# Patient Record
Sex: Female | Born: 1950 | Race: White | Hispanic: No | Marital: Married | Smoking: Never smoker
Health system: Southern US, Community
[De-identification: ages and names within clinical notes are randomized; demographics above are authoritative.]

## PROBLEM LIST (undated history)

## (undated) DIAGNOSIS — C801 Malignant (primary) neoplasm, unspecified: Secondary | ICD-10-CM

## (undated) DIAGNOSIS — Z8709 Personal history of other diseases of the respiratory system: Secondary | ICD-10-CM

## (undated) DIAGNOSIS — R0789 Other chest pain: Secondary | ICD-10-CM

## (undated) DIAGNOSIS — H33319 Horseshoe tear of retina without detachment, unspecified eye: Secondary | ICD-10-CM

## (undated) DIAGNOSIS — M170 Bilateral primary osteoarthritis of knee: Secondary | ICD-10-CM

## (undated) DIAGNOSIS — Z659 Problem related to unspecified psychosocial circumstances: Secondary | ICD-10-CM

## (undated) DIAGNOSIS — Z8659 Personal history of other mental and behavioral disorders: Secondary | ICD-10-CM

## (undated) HISTORY — PX: KNEE SURGERY: SHX244

## (undated) HISTORY — DX: Horseshoe tear of retina without detachment, unspecified eye: H33.319

## (undated) HISTORY — DX: Personal history of other diseases of the respiratory system: Z87.09

## (undated) HISTORY — DX: Bilateral primary osteoarthritis of knee: M17.0

## (undated) HISTORY — PX: FOOT SURGERY: SHX648

## (undated) HISTORY — DX: Problem related to unspecified psychosocial circumstances: Z65.9

## (undated) HISTORY — DX: Personal history of other mental and behavioral disorders: Z86.59

## (undated) HISTORY — DX: Other chest pain: R07.89

---

## 1980-03-22 ENCOUNTER — Encounter: Payer: Self-pay | Admitting: Internal Medicine

## 1991-11-17 ENCOUNTER — Encounter: Payer: Self-pay | Admitting: Internal Medicine

## 1999-06-21 ENCOUNTER — Other Ambulatory Visit: Admission: RE | Admit: 1999-06-21 | Discharge: 1999-06-21 | Payer: Self-pay | Admitting: *Deleted

## 1999-07-07 ENCOUNTER — Other Ambulatory Visit: Admission: RE | Admit: 1999-07-07 | Discharge: 1999-07-07 | Payer: Self-pay | Admitting: Emergency Medicine

## 1999-10-31 ENCOUNTER — Ambulatory Visit (HOSPITAL_COMMUNITY): Admission: RE | Admit: 1999-10-31 | Discharge: 1999-10-31 | Payer: Self-pay | Admitting: *Deleted

## 1999-10-31 ENCOUNTER — Encounter (INDEPENDENT_AMBULATORY_CARE_PROVIDER_SITE_OTHER): Payer: Self-pay

## 2000-07-05 ENCOUNTER — Other Ambulatory Visit: Admission: RE | Admit: 2000-07-05 | Discharge: 2000-07-05 | Payer: Self-pay | Admitting: *Deleted

## 2001-07-16 ENCOUNTER — Other Ambulatory Visit: Admission: RE | Admit: 2001-07-16 | Discharge: 2001-07-16 | Payer: Self-pay | Admitting: Obstetrics and Gynecology

## 2001-07-22 ENCOUNTER — Encounter: Payer: Self-pay | Admitting: Obstetrics and Gynecology

## 2001-07-22 ENCOUNTER — Encounter: Admission: RE | Admit: 2001-07-22 | Discharge: 2001-07-22 | Payer: Self-pay | Admitting: Obstetrics and Gynecology

## 2001-10-23 ENCOUNTER — Encounter: Admission: RE | Admit: 2001-10-23 | Discharge: 2001-10-23 | Payer: Self-pay | Admitting: Pediatrics

## 2001-10-23 ENCOUNTER — Encounter: Payer: Self-pay | Admitting: Pediatrics

## 2001-10-23 ENCOUNTER — Ambulatory Visit (HOSPITAL_COMMUNITY): Admission: RE | Admit: 2001-10-23 | Discharge: 2001-10-23 | Payer: Self-pay | Admitting: Pediatrics

## 2001-12-25 ENCOUNTER — Encounter: Payer: Self-pay | Admitting: Obstetrics and Gynecology

## 2001-12-25 ENCOUNTER — Encounter: Admission: RE | Admit: 2001-12-25 | Discharge: 2001-12-25 | Payer: Self-pay | Admitting: Obstetrics and Gynecology

## 2002-01-07 ENCOUNTER — Encounter: Admission: RE | Admit: 2002-01-07 | Discharge: 2002-01-07 | Payer: Self-pay | Admitting: Orthopedic Surgery

## 2002-01-07 ENCOUNTER — Encounter: Payer: Self-pay | Admitting: Orthopedic Surgery

## 2002-04-01 ENCOUNTER — Encounter: Payer: Self-pay | Admitting: Internal Medicine

## 2002-05-22 ENCOUNTER — Ambulatory Visit (HOSPITAL_COMMUNITY): Admission: RE | Admit: 2002-05-22 | Discharge: 2002-05-22 | Payer: Self-pay | Admitting: Obstetrics and Gynecology

## 2002-05-22 ENCOUNTER — Encounter: Payer: Self-pay | Admitting: Obstetrics and Gynecology

## 2002-06-30 ENCOUNTER — Encounter: Admission: RE | Admit: 2002-06-30 | Discharge: 2002-06-30 | Payer: Self-pay | Admitting: Sports Medicine

## 2002-08-05 ENCOUNTER — Encounter: Payer: Self-pay | Admitting: Internal Medicine

## 2002-08-27 ENCOUNTER — Encounter: Admission: RE | Admit: 2002-08-27 | Discharge: 2002-08-27 | Payer: Self-pay | Admitting: Allergy and Immunology

## 2002-08-27 ENCOUNTER — Encounter: Payer: Self-pay | Admitting: Internal Medicine

## 2002-09-08 ENCOUNTER — Other Ambulatory Visit: Admission: RE | Admit: 2002-09-08 | Discharge: 2002-09-08 | Payer: Self-pay | Admitting: Obstetrics and Gynecology

## 2002-09-09 ENCOUNTER — Encounter: Payer: Self-pay | Admitting: Internal Medicine

## 2003-01-28 ENCOUNTER — Encounter: Payer: Self-pay | Admitting: Internal Medicine

## 2003-11-16 ENCOUNTER — Other Ambulatory Visit: Admission: RE | Admit: 2003-11-16 | Discharge: 2003-11-16 | Payer: Self-pay | Admitting: Obstetrics and Gynecology

## 2005-02-09 ENCOUNTER — Other Ambulatory Visit: Admission: RE | Admit: 2005-02-09 | Discharge: 2005-02-09 | Payer: Self-pay | Admitting: Obstetrics and Gynecology

## 2005-07-11 ENCOUNTER — Encounter: Payer: Self-pay | Admitting: Internal Medicine

## 2005-11-12 ENCOUNTER — Encounter: Payer: Self-pay | Admitting: Internal Medicine

## 2006-03-06 ENCOUNTER — Other Ambulatory Visit: Admission: RE | Admit: 2006-03-06 | Discharge: 2006-03-06 | Payer: Self-pay | Admitting: Obstetrics and Gynecology

## 2007-03-12 ENCOUNTER — Other Ambulatory Visit: Admission: RE | Admit: 2007-03-12 | Discharge: 2007-03-12 | Payer: Self-pay | Admitting: Obstetrics and Gynecology

## 2007-08-15 ENCOUNTER — Encounter: Payer: Self-pay | Admitting: Internal Medicine

## 2008-03-03 ENCOUNTER — Encounter: Payer: Self-pay | Admitting: Internal Medicine

## 2008-03-17 ENCOUNTER — Other Ambulatory Visit: Admission: RE | Admit: 2008-03-17 | Discharge: 2008-03-17 | Payer: Self-pay | Admitting: Obstetrics and Gynecology

## 2008-09-22 ENCOUNTER — Encounter: Payer: Self-pay | Admitting: Internal Medicine

## 2009-03-21 ENCOUNTER — Other Ambulatory Visit: Admission: RE | Admit: 2009-03-21 | Discharge: 2009-03-21 | Payer: Self-pay | Admitting: Obstetrics and Gynecology

## 2009-07-18 ENCOUNTER — Encounter: Payer: Self-pay | Admitting: Internal Medicine

## 2009-08-01 ENCOUNTER — Ambulatory Visit: Payer: Self-pay | Admitting: Internal Medicine

## 2009-08-01 DIAGNOSIS — M129 Arthropathy, unspecified: Secondary | ICD-10-CM | POA: Insufficient documentation

## 2009-08-01 DIAGNOSIS — R0602 Shortness of breath: Secondary | ICD-10-CM | POA: Insufficient documentation

## 2009-08-01 DIAGNOSIS — J45909 Unspecified asthma, uncomplicated: Secondary | ICD-10-CM | POA: Insufficient documentation

## 2009-08-23 ENCOUNTER — Ambulatory Visit: Payer: Self-pay | Admitting: Internal Medicine

## 2009-08-23 DIAGNOSIS — R0982 Postnasal drip: Secondary | ICD-10-CM | POA: Insufficient documentation

## 2009-08-30 ENCOUNTER — Encounter: Payer: Self-pay | Admitting: Internal Medicine

## 2009-08-30 ENCOUNTER — Ambulatory Visit (HOSPITAL_COMMUNITY): Admission: RE | Admit: 2009-08-30 | Discharge: 2009-08-30 | Payer: Self-pay | Admitting: Internal Medicine

## 2009-09-12 ENCOUNTER — Telehealth: Payer: Self-pay | Admitting: Internal Medicine

## 2009-09-13 ENCOUNTER — Encounter: Payer: Self-pay | Admitting: Internal Medicine

## 2009-10-19 ENCOUNTER — Encounter: Payer: Self-pay | Admitting: Internal Medicine

## 2009-10-19 ENCOUNTER — Ambulatory Visit (HOSPITAL_COMMUNITY): Admission: RE | Admit: 2009-10-19 | Discharge: 2009-10-19 | Payer: Self-pay | Admitting: Internal Medicine

## 2009-11-04 ENCOUNTER — Telehealth (INDEPENDENT_AMBULATORY_CARE_PROVIDER_SITE_OTHER): Payer: Self-pay | Admitting: *Deleted

## 2009-11-16 ENCOUNTER — Ambulatory Visit: Payer: Self-pay | Admitting: Internal Medicine

## 2009-11-17 ENCOUNTER — Telehealth (INDEPENDENT_AMBULATORY_CARE_PROVIDER_SITE_OTHER): Payer: Self-pay | Admitting: *Deleted

## 2009-12-01 ENCOUNTER — Encounter: Payer: Self-pay | Admitting: Internal Medicine

## 2009-12-05 ENCOUNTER — Encounter: Payer: Self-pay | Admitting: Internal Medicine

## 2009-12-29 ENCOUNTER — Telehealth: Payer: Self-pay | Admitting: Internal Medicine

## 2010-01-18 ENCOUNTER — Telehealth (INDEPENDENT_AMBULATORY_CARE_PROVIDER_SITE_OTHER): Payer: Self-pay | Admitting: *Deleted

## 2010-04-17 ENCOUNTER — Other Ambulatory Visit
Admission: RE | Admit: 2010-04-17 | Discharge: 2010-04-17 | Payer: Self-pay | Source: Home / Self Care | Admitting: Obstetrics and Gynecology

## 2010-06-13 NOTE — Progress Notes (Signed)
Summary: records and referral  Phone Note Call from Patient Call back at [facility] Phone [phone]   Caller: Patient Call For: ramaswamy Reason for Call: Talk to Nurse Summary of Call: pt has appt tomorrow morning at 8:40am - wanted to know if records had been sent to Dr. Arlyss Gandy for allergy appt tomorrow?  Also, checking on Physical Therapy.  MR was going to have someone call her about setting this up. Initial call taken by: Eugene Gavia,  January 18, 2010 1:48 PM  Follow-up for Phone Call        Spoke with pt and made aware that order was sent to pulm rehab and that the people from [facility] will call her with appt details.  Pt verbalized understanding.  She states that she thinks that Dr Willa Rough who was the doc who sent her to see MR needs most recent records.  I faxed all ov notes, MCT, and PFTs to Dr Willa Rough at [phone]. Pt aware. Follow-up by: Vernie Murders,  January 18, 2010 3:18 PM

## 2010-06-13 NOTE — Progress Notes (Signed)
Summary: test results  Phone Note Call from Patient Call back at [phone]   Caller: Patient Call For: ramaswamy Reason for Call: Lab or Test Results Summary of Call: Wants results of stress test. Initial call taken by: Darletta Moll,  November 17, 2009 1:04 PM  Follow-up for Phone Call        MR, pt calling for results of stress test.  Test was done on October 19, 2009 - pt still has not heard anything re: results.  Pls advise.  Thanks.  Gweneth Dimitri RN  November 17, 2009 2:05 PM   Additional Follow-up for Phone Call Additional follow up Details #1::        Finally computer worked for me to rview. I have been doing some final discussions with the tech and havbe been emailing past 2 days. I should give a call ti patient today/tomorrow Additional Follow-up by: Kalman Shan MD,  November 17, 2009 2:17 PM    Additional Follow-up for Phone Call Additional follow up Details #2::    Spoke with pt and advised that MR stated he should be giving her a call today or tommorrow with these results. Follow-up by: Vernie Murders,  November 17, 2009 2:19 PM   Appended Document: test results d/w patient: pls change primar contact to cell [phone] her cell. CPST shows restriction in her chest as cause of dyspnea. Unclear why she would have that. ADvised referral to Dr Brayton El at [facility] neurology to rule out neuromuscular disorders. Pls set this referral up and let her know on her cell.After she sees Dr. Terrace Arabia she should come and see me  Appended Document: Orders Update    Clinical Lists Changes  Orders: Added new Referral order of Neurology Referral (Neuro) - Signed  Order place and sent to [facility] with all instructions to give patient regarding follow-up. Michel Bickers [facility]  November 18, 2009 9:48 AM      Appended Document: test results She saw Dr. Terrace Arabia at [facility] neuro on 7/25. Pls call that office and get blood test results that Dr. Terrace Arabia did.  Appended Document: test results I have called Dr Zannie Cove office  and requested that these labs be faxed to triage faxline.    Appended Document: test results Labs in MR's lookat  Appended Document: test results reviewed TSH, AChR Bindding Abs, Total CK - 12/05/2009 all normal at Dr Terrace Arabia office. I will call her to discuss

## 2010-06-13 NOTE — Progress Notes (Signed)
Summary: results  Phone Note Call from Patient   Caller: Patient Call For: TRIAGE Summary of Call: pt wants results of pulm stress test. cell [phone] Initial call taken by: Tivis Ringer, CNA,  November 04, 2009 3:17 PM  Follow-up for Phone Call        Spoke with pt and advised that MR out of the office until 11/10/09 and will address her results after he returns.  Pt okay with waiting for results. Follow-up by: Vernie Murders,  November 04, 2009 3:27 PM  Additional Follow-up for Phone Call Additional follow up Details #1::        i am also backed up with computer crashing x 3 days before I left for [place] Additional Follow-up by: Kalman Shan MD,  November 10, 2009 4:53 PM    Additional Follow-up for Phone Call Additional follow up Details #2::    Please advise when test results are ready.Michel Bickers CMA  November 10, 2009 5:02 PM   Appended Document: results Signed by mistake. Will forward to Dr. Marchelle Gearing.

## 2010-06-13 NOTE — Miscellaneous (Signed)
Summary: Orders Update pft charges  Clinical Lists Changes  Orders: Added new Service order of Carbon Monoxide diffusing w/capacity (94720) - Signed Added new Service order of Lung Volumes (94240) - Signed Added new Service order of Spirometry (Pre & Post) (94060) - Signed 

## 2010-06-13 NOTE — Consult Note (Signed)
Summary: [facility]  [facility]   Imported By: Lennie Odor 12/19/2009 14:57:50  _____________________________________________________________________  External Attachment:    Type:   Image     Comment:   External Document

## 2010-06-13 NOTE — Assessment & Plan Note (Signed)
Summary: full pft 2-3wk reck/klw   Visit Type:  Follow-up Copy to:  Dr. Colonel Bald  Primary Provider/Referring Provider:  Dr. Shaune Pollack - PMD, Dr Willa Rough - Allergist.  CC:  Pt here for follow-up to discuss pft results. .  History of Present Illness: IOV 08/01/2009: 60 year old female. c/o difficulty breathing or shortness of breath.  Insidious onset. Present for a long long time. She thinks this is the way she is. Present even high school. Worse in spring  Dyspnea associated with upper-Chest tightness and lower neck tightness. Feels that she is unable to take a good deep breath and fill her lungs with enough air.  Made worse by stress before pft's, spring time, . Helped by coffee.  Associated post nasal drip present but is kept under control by allergy shots.   Was on allergy shots for above for "first 20 years of life".  REsumed at age [age] around 49 when she moved to [place], [place] from [place].  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  Recollects that one spring at [place], [place] chest tightness was significant enought due to pollen that she was treated with prednisone. This helped. Since then she recollects 1 or 2 more prednisoen bursts usually in spring. Last prednisone intake few years ago. Been in [place] since 2000 and been patient of Dr. Willa Rough allergy since then. Did have PFTs at [facility] 2002-2004 but now not retrievable. It appears this was abnormal. Had CT chest 08/27/2002 at [facility] and normal per report  Recent stress past few years; sick daughter with PANDIS due to strep in 2008 but now alcohol issues. Says thinking of duaghter's issues chokes her up and causes chest tightness. Feels duaghter's issue is 'taking a toll on my nerves". This has accentuated symptoms. Feels talking about this is provoking symptoms. Reports fear of public speaking  Per patient, Dr Willa Rough feels annual  spirometries are deteriorating and therefore referred here. REview of thse spiro 2009 - > 2011 all show restrictio 75%. QVAR and singulair not helping  Denies associated wheezing, hemoptysis, fever, chills, night sweats, weight loss, oprthopnea, paroxysmal nocutrnal dyspnea.   REC: PFTs after QVAR/singulair wash OV 08/19/2009:Followup after PFTs. She is here with husband today. Symptoms persist. Today she is repoting for first time mild-moderate stable, chronic post nasal drainage that results in tickle in throat. Otherwise no new symptoms. She tried to get old records from her ENT in [place] where 'laryngospasm' was diagnosed but charts have been destroyed. She has other old charts which she faxed to wrong number but will try again. Had PFTs today. I independently reviewed it. Cleda Daub suggests restriction with FVC at 2L/53% but TLC is 5L/81% and normal, DLCO is 21/92%. There was no BD response  Current Medications (verified): 1)  Wellbutrin Xl 150 Mg Xr24h-Tab (Bupropion Hcl) .... Two Times A Day 2)  Proventil Hfa 108 (90 Base) Mcg/act Aers (Albuterol Sulfate) .... 2 Puffs Every 4 Hours As Needed 3)  Multivitamins  Tabs (Multiple Vitamin) .... Once Daily 4)  Vitamin D 2000 Unit Tabs (Cholecalciferol) .... Once Daily  Allergies (verified): No Known Drug Allergies  Past History:  Family History: Last updated: 08/01/2009 allergies - mother asthma- mother heart disease - father cancer - MGM- uterine, PGM ? 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 #ARTHRITIS (ICD-716.90) #ASTHMA (ICD-493.90) #ALLERGIES > Per Hx proven skin test positive to mold, dust, grass, mites, pollen, etc > Symptoms when not under control are runny nose, red eyes, cough, sinus, trees, dog,  cats POST NASAL DRIP > regardless of allergy control #Denies bp, dm, stroke, hyperliipdiemia, lupus #Lot of stress, fear of public speaking  Past Surgical History: Reviewed history from 08/01/2009 and no changes required. right and left knee surgery  right foot surgery D & C  Family History: Reviewed history from 08/01/2009 and no changes required. allergies - mother asthma- mother heart disease - father cancer - MGM- uterine, PGM ? type, PGF-hogkins  Social History: Reviewed history from 08/01/2009 and no changes required. 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  Comments Medications reviewed with patient Carron Curie CMA  August 23, 2009 10:48 AM Daytime phone number verified with patient.    Physical Exam  General:  well developed, well nourished, in no acute distress Head:  normocephalic and atraumatic Eyes:  PERRLA/EOM intact; conjunctiva and sclera clear Ears:  TMs intact and clear with normal canals Nose:  no deformity, discharge, inflammation, or lesions Mouth:  no deformity or lesions Neck:  no masses, thyromegaly, or abnormal cervical nodes Chest Wall:  no deformities noted Lungs:  clear bilaterally to auscultation and percussion Heart:  regular rate and rhythm, S1, S2 without murmurs, rubs, gallops, or clicks Abdomen:  bowel sounds positive; abdomen soft and non-tender without masses, or organomegaly Msk:  no deformity or scoliosis noted with  normal posture Pulses:  pulses normal Extremities:  no clubbing, cyanosis, edema, or deformity noted Neurologic:  CN II-XII grossly intact with normal reflexes, coordination, muscle strength and tone Skin:  intact without lesions or rashes Cervical Nodes:  no significant adenopathy Axillary Nodes:  no significant adenopathy Psych:  depressed affect and anxious.  Bit nervous. Saus she feels like getting choked talking about her daughter   MISC. Report  Procedure date:  08/23/2009  Findings:      PFTs today. I independently reviewed it. Cleda Daub suggests restriction with FVC at 2L/53% but TLC is 5L/81% and normal, DLCO is 21/92%. There was no BD response  Impression & Recommendations:  Problem # 1:  POSTNASAL DRIP (ICD-784.91) Assessment New start fluticasone nasal spray  Orders: Prescription Created Electronically [phone]) Est. Patient Level III (57322)  Problem # 2:  SHORTNESS OF BREATH (SOB) (ICD-786.05) Assessment: Unchanged  Orders: Pulmonary Referral (Pulmonary) Est. Patient Level III (02542)  Chronic dyspnea with some asthma features and some VCD features in non-smoking woman nasal allergies but with restrictive sprimetries. I am not sure if there is just one etiology or multiple. I am not sure if she has co-existent asthma or not. Possibly VCD (  vocal cord dysfn) can explain everything. PFTs 08/23/2009: essentially normal except spiro is suggestive of restriction  PLAN Gaver her corrext fax number to fax old records Get methacholine challenge test If positive, treat for asthma If negative, do CPST and then Lary for VCD (she is veyr nervous about lary and will do it as last test)  Medications Added to Medication List This Visit: 1)  Fluticasone Propionate 50 Mcg/act Susp (Fluticasone propionate) .... 2 sprays into each nostril bid  Patient Instructions: 1)  please fax your old records to [phone] under my attention 2)  please have methacholine challenge test 3)  once  test is done, we can decide on phone if you need additional testing or you come and see me 4)  call me once testing is complete to review [phone] 5)  take some nasal steroid samples (2-3), use it 2 squirts each nostril daily for post nasal drrip along with nett pot 6)    Prescriptions: FLUTICASONE PROPIONATE 50 MCG/ACT SUSP (FLUTICASONE PROPIONATE) 2 sprays into each nostril bid  #1 x 6   Entered and Authorized by:   Kalman Shan MD   Signed by:   Kalman Shan MD on 08/23/2009   Method used:   Electronically to        [facility] [address] [phone]* (retail)       [address]       [place], [place]  [address]       Ph: [phone] or [phone]       Fax: [phone]   RxID:   [id number]   Appended Document: full pft 2-3wk reck/klw called and d/w patient. STill with symptoms. Cannot take a good deep breath. Neuromuscular workup negative. HAve advised pulmonary rehab (if cost and time factor ok for her). IF choking symptoms persist despite rehab or otherwise more than dyspnea, then will refer to speech therapy. FOr now, rehab trial.   Candise Bowens: pls make a referral for pulmonary rehab  Appended Document: pulm rehab referral    Clinical Lists Changes  Orders: Added new Referral order of Rehabilitation Referral (Rehab) - Signed

## 2010-06-13 NOTE — Letter (Signed)
Summary: CPST- R/O Contraindications  [facility]  [address]   [place], [place] [address]   Phone: [phone]  Fax: [phone]    Patient's Name: Anna Kennedy Date of Birth: February 23, 1951 MRN: [id number]  *********Rule out Contraindications**************** Absolute                                                                                                                           ___ Acute MI (3-5 Days)                                 ___ Unstable Angina                                          ___ Uncontrolled arrhythmias causing symptoms or hemodynamic compromise. ___ Syncope                                                     ___ Active endocarditis                                         ___ Acute Myocarditis/Pericarditis                        ___ Symptomatic severe aortic stenosis  ___ Acute Pulmonary embolus or pulmonary infarction                ___ Uncontrolled Heart Failure  ___ Thrombosis of lower extremitie ___ Suspected dissecting aneurysm ___ Uncontrolled Asthma                          ___ Pulmonary Edema                                        ___ RA desat @ rest<85%                                      ___ Repiratory Failure                                         ___ Acute noncardiopulmonary disorder that may affect exercise performance or be         aggravated by exercise (ie infection, renal failure,  thyrotoxicosis) .                               ___ Mental impairment leading to inabliity to cooperate   Relative ___ Left main coronary stenosis or its equivalent ___ Moderate stentoic valvular heart disease ___ Severe untreated arterial hypertension @ rest (<200 mmHg             systolic,>150mmHg Diastolic ___ Tachy/Brady Arrhythmias ___ High- degree artioventricular block ___ Hypertrophic cardiomyopathy ___ Significant pulmonary hypertension ___ Advanced or complicated pregnancy ___ Electrolyte abnormalities ___ Orthopedic impairment that  compromises exercise performance  NONE  Kalman Shan MD    [facility]

## 2010-06-13 NOTE — Progress Notes (Signed)
Summary: refer pulm rehab  Phone Note Outgoing Call   Summary of Call: this reflects conversation on 12/23/2009. Advied pulmonary rehab. See addendum to april note. ORder was placed Initial call taken by: Kalman Shan MD,  December 29, 2009 4:43 PM

## 2010-06-13 NOTE — Progress Notes (Signed)
Summary: RESULTS -  Phone Note Call from Patient Call back at [phone]   Caller: Patient Call For: [facility] Summary of Call: RESULTS OF PFT Initial call taken by: Rickard Patience,  Sep 12, 2009 5:01 PM  Follow-up for Phone Call        Please advise on methacoline challenge test results. results in your look-at.  Carron Curie CMA  Sep 12, 2009 5:06 PM   Additional Follow-up for Phone Call Additional follow up Details #1::        byrum yet to read it. only prelim copy present. Pls check if Byrum read it and please place final copy at my look at. Pls let patient know Additional Follow-up by: Kalman Shan MD,  Sep 13, 2009 2:23 AM    Additional Follow-up for Phone Call Additional follow up Details #2::    ATC pt at both #s listed - LMOM TCB on each.  ....   Pt returned our call.  Informed her that we only have the prelim report back and are still waiting on the final results from the interpreting physician.  informed pt we will call her once we get these results.  pt asked to be called on her cell at [phone] .Marland KitchenAundra Millet Reynolds LPN  Sep 13, 1912 10:50 AM   Lawson Fiscal, have you seen this methacoline for pt? Boone Master CNA  Sep 13, 2009 9:47 AM   Printed and given to MR. Leslye Peer MD  Sep 13, 2009 5:33 PM  Follow-up by: Leslye Peer MD,  Sep 13, 2009 5:33 PM  Additional Follow-up for Phone Call Additional follow up Details #3:: Details for Additional Follow-up Action Taken: methacholine is negative for asthma. Therefore, no asthma. Plse tell her to continue to hold her asthma meds. Pls order CPST at [facility] with exercise induced bronchspasm test. FU after that Additional Follow-up by: Kalman Shan MD,  Sep 13, 2009 5:54 PM  pt advised of results. Order palced for cpst.Jennifer [facility] CMA  Sep 14, 2009 9:07 AM

## 2010-06-13 NOTE — Letter (Signed)
Summary: [facility]  [facility]   Imported By: Sherian Rein 09/13/2009 11:24:46  _____________________________________________________________________  External Attachment:    Type:   Image     Comment:   External Document

## 2010-06-13 NOTE — Letter (Signed)
Summary: Penni Homans MD  Penni Homans MD   Imported By: Sherian Rein 09/13/2009 11:23:19  _____________________________________________________________________  External Attachment:    Type:   Image     Comment:   External Document

## 2010-06-13 NOTE — Letter (Signed)
Summary: [facility]  [address]   [place], [place] [address]   Phone: [phone]  Fax: [phone]     Patient's Name: Anna Kennedy Date of Birth: 1950/11/09 MRN: [id number]  CPST  Choose test method and choice  a)__x_Bike - recommended by ATS/ACCP. Do at [facility] at Dr. Gala Romney [facility]  b)___Treadmill - less preferred. Do at [facility] at Dr. Gala Romney lab or do at [facility] PFT lab  Choose one or more indication for test  INDICATIONS FOR CARDIOPULMONARY EXERCISE TESTING Evaluation of exercise tolerance ______ Determination of functional impairment or capacity (peak V? O2) ______ Determination of exercise-limiting factors and pathophysiologic mechanisms  Evaluation of undiagnosed exercise intolerance __x___ Assessing contribution of cardiac and pulmonary etiology in coexisting disease __x___ Symptoms disproportionate to resting pulmonary and cardiac tests  __x___Unexplained dyspnea when initial cardiopulmonary testing is nondiagnostic  Evaluation of patients with cardiovascular disease _____ Functional evaluation and prognosis in patients with heart failure _____ Selection for cardiac transplantation _____ Exercise prescription and monitoring response to exercise training for cardiac rehabilitation (special circumstances; i.e., pacemakers)  Evaluation of patients with respiratory disease _____ Functional impairment assessment (see specific clinical applications)  _____Chronic obstructive pulmonary disease Establishing exercise limitation(s) and assessing other potential contributing factors, especially occult heart disease (ischemia) ______Determination of magnitude of hypoxemia and for O2 prescription When objective determination of therapeutic intervention is necessary and not adequately addressed by standard pulmonary function testing  _____ Interstitial lung diseases _____Detection of early (occult) gas exchange abnormalities _____Overall  assessment/monitoring of pulmonary gas exchange _____Determination of magnitude of hypoxemia and for O2 prescription _____Determination of potential exercise-limiting factors _____Documentation of therapeutic response to potentially toxic therapy  ____ Pulmonary vascular disease (careful risk-benefit analysis required)  ____ Cystic fibrosis  __x__ Exercise-induced bronchospasm  Specific clinical applications ____  Preoperative evaluation _____Lung resectional surgery _____Elderly patients undergoing major abdominal surgery _____Lung volume resectional surgery for emphysema (currently investigational)  ____ Exercise evaluation and prescription for pulmonary rehabilitation  ____ Evaluation for impairment-disability  ____ Evaluation for lung, heart-lung transplantation ____ Definition of abbreviation: V? O2______ -oxygen consumption.    Kalman Shan MD    [facility]

## 2010-06-13 NOTE — Assessment & Plan Note (Signed)
Summary: obstructive lung disease/jd   Visit Type:  Initial Consult Copy to:  Dr. Colonel Bald  Primary Provider/Referring Provider:  Dr. Shaune Pollack - PMD, Dr Willa Rough - Allergist.  CC:  Pulomonary Consult for asthma..  History of Present Illness: IOV 08/01/2009: 60 year old female. c/o difficulty breathing or shortness of breath.  Insidious onset. Present for a long long time. She thinks this is the way she is. Present even high school. Worse in spring  Dyspnea associated with upper-Chest tightness and lower neck tightness. Feels that she is unable to take a good deep breath and fill her lungs with enough air.  Made worse by stress before pft's, spring time, . Helped by coffee.  Associated post nasal drip present but is kept under control by allergy shots.   Was on allergy shots for above for "first 20 years of life".  REsumed at age [age] around 21 when she moved to [place], [place] from [place].  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  Recollects that one spring at [place], [place] chest tightness was significant enought due to pollen that she was treated with prednisone. This helped. Since then she recollects 1 or 2 more prednisoen bursts usually in spring. Last prednisone intake few years ago. Been in [place] since 2000 and been patient of Dr. Willa Rough allergy since then. Did have PFTs at [facility] 2002-2004 but now not retrievable. It appears this was abnormal. Had CT chest 08/27/2002 at [facility] and normal per report  Recent stress past few years; sick daughter with PANDIS due to strep in 2008 but now alcohol issues. Says thinking of duaghter's issues chokes her up and causes chest tightness. Feels duaghter's issue is 'taking a toll on my nerves". This has accentuated symptoms. Feels talking about this is provoking symptoms. Reports fear of public speaking  Per patient, Dr Willa Rough feels annual  spirometries are deteriorating and therefore referred here. REview of thse spiro 2009 - > 2011 all show restrictio 75%. QVAR and singulair not helping  Denies associated wheezing, hemoptysis, fever, chills, night sweats, weight loss, oprthopnea, paroxysmal nocutrnal dyspnea.   Current Medications (verified): 1)  Singulair 10 Mg Tabs (Montelukast Sodium) .... Once Daily 2)  Wellbutrin Xl 150 Mg Xr24h-Tab (Bupropion Hcl) .... Two Times A Day 3)  Qvar 40 Mcg/act Aers (Beclomethasone Dipropionate) .... 2 Puffs Two Times A Day 4)  Proventil Hfa 108 (90 Base) Mcg/act Aers (Albuterol Sulfate) .... 2 Puffs Every 4 Hours As Needed 5)  Multivitamins  Tabs (Multiple Vitamin) .... Once Daily 6)  Vitamin D 2000 Unit Tabs (Cholecalciferol) .... Once Daily  Allergies (verified): No Known Drug Allergies  Past History:  Family History: Last updated: 08/01/2009 allergies - mother asthma- mother heart disease - father cancer - MGM- uterine, PGM ? 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  The patient denies productive cough, non-productive cough, coughing up blood, chest pain, irregular heartbeats, acid heartburn, indigestion, loss of appetite, weight change, abdominal pain, difficulty swallowing, sore throat, tooth/dental problems, headaches, sneezing, itching, ear ache, depression, hand/feet swelling, joint stiffness or pain, rash, change in color of mucus, and fever.    Vital Signs:  Patient profile:   59 year old female Height:      70 inches Weight:      157 pounds BMI:     22.61 O2 Sat:      97 % on Room air Temp:     97.8 degrees F oral Pulse rate:   70 / minute BP sitting:   112 / 68  (right arm) Cuff size:   regular  Vitals Entered By: Gweneth Dimitri RN (August 01, 2009 11:39 AM)  O2 Flow:  Room air CC: Pulomonary Consult for asthma. Comments Medications reviewed with patient Daytime contact number verified with patient. Gweneth Dimitri RN  August 01, 2009 11:39 AM    Physical Exam  General:  well developed, well nourished, in no acute distress Head:  normocephalic and atraumatic Eyes:  PERRLA/EOM intact; conjunctiva and sclera clear Ears:  TMs intact and clear with normal canals Nose:  no deformity, discharge, inflammation, or lesions Mouth:  no deformity or lesions Neck:  no masses, thyromegaly, or abnormal cervical nodes Chest Wall:  no deformities noted Lungs:  clear bilaterally to auscultation and percussion Heart:  regular rate and rhythm, S1, S2 without murmurs, rubs, gallops, or clicks Abdomen:  bowel sounds positive; abdomen soft and non-tender without masses, or organomegaly Msk:  no deformity or scoliosis noted with normal posture Pulses:  pulses normal Extremities:  no clubbing, cyanosis, edema, or deformity noted Neurologic:  CN II-XII grossly intact with normal reflexes, coordination, muscle  strength and tone Skin:  intact without lesions or rashes Cervical Nodes:  no significant adenopathy Axillary Nodes:  no significant adenopathy Psych:  depressed affect and anxious.  Bit nervous. Saus she feels like getting choked talking about her daughter   CT of Chest  Procedure date:  08/27/2002  Findings:      ON LUNG WINDOW IMAGES, THERE ARE A FEW FOCI OF PLEURAL   THICKENING LATERALLY, PARTICULARLY IN THE UPPER   HEMITHORACES OF DOUBTFUL SIGNIFICANCE.  NO PARENCHYMAL   LUNG LESION IS SEEN.  NO NODULE IS EVIDENT.  THERE IS NO   EVIDENCE OF EFFUSION.  SELECTED HIGH RESOLUTION SCANS WERE   PERFORMED AT THE END OF THE STUDY SHOWING NO SIGNIFICANT   ABNORMALITY.  ON MEDIASTINAL WINDOW IMAGES, THERE IS NO   EVIDENCE OF MEDIASTINAL OR HILAR ADENOPATHY AND NO   AXILLARY ADENOPATHY IS SEEN.  THE MAIN PULMONARY ARTERY   MAY BE SLIGHTLY PROMINENT, A FINDING OF QUESTIONABLE   SIGNIFICANCE.  THE PULMONARY ARTERIES APPEAR GROSSLY   NORMAL, HOWEVER, ON RECENT CHEST X-RAY.  IMPRESSION:  NEGATIVE CT OF THE CHEST.  THE LUNGS APPEAR   WELL AERATED WITH NO EVIDENCE OF LUNG DISEASE.  NO ADENOPATHY IS SEEN.  Comments:      no image for review  MISC. Report  Procedure date:  08/01/2009  Findings:      2 spirometries from outside chart revieed. All are suggestive of restriction  Impression & Recommendations:  Problem # 1:  SHORTNESS OF BREATH (SOB) (ICD-786.05) Assessment New  Chronic dyspnea with some asthma features and some VCD features in non-smoking woman nasal allergies but with restrictive sprimetries. I am not sure if there is just one etiology or multiple. I am not sure if she has co-existent asthma or not. Possibly VCD (vocal cord dysfn) can explain everything.   PLAN Stop QVAR and sngulair for wash out for 2-3 weeks Do FULL PFTs Depending on results, do imageing or methacholine challenge test   Get ENT report from 1993 if possible - per her hx this is very suggestive of  VCD  Orders: Consultation Level V (09811)  Medications Added to Medication List This Visit: 1)  Singulair 10 Mg Tabs (Montelukast sodium) .... Once daily 2)  Wellbutrin Xl 150 Mg Xr24h-tab (Bupropion hcl) .... Two times a day 3)  Qvar 40 Mcg/act Aers (Beclomethasone dipropionate) .... 2 puffs two times a day 4)  Proventil Hfa 108 (90 Base) Mcg/act Aers (Albuterol sulfate) .... 2 puffs every 4 hours as needed 5)  Multivitamins Tabs (Multiple vitamin) .... Once daily 6)  Vitamin D 2000 Unit Tabs (Cholecalciferol) .... Once daily  Other Orders: Pulmonary Referral (Pulmonary)  Patient Instructions: 1)  please stop singulair and qvar 2)  return in 2-3 weeks with full PFTs   Immunization History:  Influenza Immunization History:    Influenza:  historical (02/11/2009)

## 2010-06-15 NOTE — Letter (Signed)
Summary: [facility]  [facility]   Imported By: Sherian Rein 05/10/2010 15:57:35  _____________________________________________________________________  External Attachment:    Type:   Image     Comment:   External Document

## 2010-06-15 NOTE — Letter (Signed)
Summary: [facility]  [facility]   Imported By: Sherian Rein 05/10/2010 16:02:40  _____________________________________________________________________  External Attachment:    Type:   Image     Comment:   External Document

## 2010-06-15 NOTE — Letter (Signed)
Summary: [facility]  [facility]   Imported By: Sherian Rein 05/10/2010 16:01:53  _____________________________________________________________________  External Attachment:    Type:   Image     Comment:   External Document

## 2010-06-15 NOTE — Letter (Signed)
Summary: [facility]  [facility]   Imported By: Sherian Rein 05/10/2010 16:01:00  _____________________________________________________________________  External Attachment:    Type:   Image     Comment:   External Document

## 2010-06-15 NOTE — Letter (Signed)
Summary: [facility]  [facility]   Imported By: Sherian Rein 05/10/2010 16:00:09  _____________________________________________________________________  External Attachment:    Type:   Image     Comment:   External Document

## 2010-06-15 NOTE — Letter (Signed)
Summary: [facility]  [facility]   Imported By: Sherian Rein 05/10/2010 15:58:29  _____________________________________________________________________  External Attachment:    Type:   Image     Comment:   External Document

## 2010-06-15 NOTE — Letter (Signed)
Summary: [facility]  [facility]    Imported By: Sherian Rein 05/10/2010 15:56:26  _____________________________________________________________________  External Attachment:    Type:   Image     Comment:   External Document

## 2010-06-15 NOTE — Letter (Signed)
Summary: [facility]  [facility]   Imported By: Sherian Rein 05/10/2010 15:59:26  _____________________________________________________________________  External Attachment:    Type:   Image     Comment:   External Document

## 2010-09-29 NOTE — Op Note (Signed)
[facility]  Patient:    Anna Kennedy, Anna Kennedy                         MRN: [id number] Proc. Date: 10/31/99 Adm. Date:  [id number] Disc. Date: [id number] Attending:  Marin Comment                           Operative Report  PREOPERATIVE DIAGNOSIS:  Dysfunctional uterine bleeding and small fundal endometrial polyp on hydrosonogram.  POSTOPERATIVE DIAGNOSIS:  Symmetrical papillary endometrium with small fundal polypoid projection.  PROCEDURES:  Exam under anesthesia, fractional D&C, hysteroscopy with resection of endometrium and endometrial polyp.  SURGEON:  Pershing Cox, M.D.  ANESTHESIA:  MAC plus Marcaine paracervical block.  INDICATION FOR PROCEDURE:  The patient is a 60 year old female who has been followed in our office for 4 months. She presented for evaluation for annual examination and gave a history of irregular menses and was started on a menstrual calendar. She was seen approximately 3 weeks later complaining of significant bleeding. FSH was 44.5 and an endometrial biopsy was performed which showed scant endocervical mucosa insufficient for diagnosis. She was scheduled for a sonogram and sonogram was performed in early April. That sonogram showed a 9 cm uterus with a small posterior myoma and a thickened endometrium with hyperechoic mass. She returned for a transvaginal hydrosonogram and on this sonogram the uterus was found to be again thickened to 0.75 cm on each surface with a 0.5 x 0.4 hyperechoic mass arising from the fundus. The patient is brought to the operating room today for resection of the endometrium and this endometrial mass.  FINDINGS:  The uterus is slightly retroverted. It is regular in shape and the myomas is not palpable. There was no evidence of adnexal enlargement. The uterine sound passed to 9 cm. Hysteroscopic visualization of the endometrial cavity showed papillary symmetrical projections of the endometrium. At  this fundal surface, there was a discreet papillary lesion similar to the remainder of the endometrium.  DESCRIPTION OF PROCEDURE:  The patient was brought to the operating room with an IV in place. She had received 1 gm of Ancef in the holding area. Supine on the OR table, IV sedation was administered and she was then placed into Allen stirrups. Exam under anesthesia was performed. Hibiclens was used to prepare the lower abdomen, perineum and vagina and she was sterilely draped for a vaginal procedure.  A bivalve speculum was inserted into the vagina and the cervix was visualized. A 0.25% Marcaine was injected into the anterior cervix which was then grasped with the single tooth tenaculum. Marcaine paracervical block was administered in the 3, 4, 7 and 8 position. A Kevorkian curette was used to obtain endocervical curettings. The uterine sound did pass to 9 cm. Serial Pratt dilators were used to dilate the cervix to size 25 and the diagnostic scope was introduced and the cavity was visualized. Once this had been completed, the cervix was further dilated to size 33 and the resectoscope was inserted using sorbitol irrigant and through and through irrigation. The right angled wire attached to the cautery machine was set at 110:110 blend 1. This right angle wire was used to serially resect the superficial walls of the endometrium. The polyp was included in this resected specimen. It was not removed discretely. The fragments of resected endometrium were all submitted as endometrial resection. At the completion of the procedure, there was no  significant bleeding. There was no residual endometrium left. A Kevorkian curette was used to scrape the walls of the endometrium and the sound sounded again to 9 cm. The procedure was tolerated well without pain and there were no intraoperative complications. DD:  10/31/99 TD:  11/02/99 Job: [id number]

## 2010-09-29 NOTE — Discharge Summary (Signed)
[facility]  Patient:    Anna Kennedy, Anna Kennedy                         MRN: [id number] Proc. Date: 10/31/99 Adm. Date:  [id number] Disc. Date: [id number] Attending:  Marin Comment                           Discharge Summary  PREOPERATIVE DIAGNOSIS:  Dysfunctional uterine bleeding and small fundal endometrial polyp on hydrosonogram.  POSTOPERATIVE DIAGNOSIS:  Symmetrical papillary endometrium with small fundal polypoid projection.  PROCEDURE:  Exam under anesthesia, fractional dilation and curettage, hysteroscopy with resection of endometrium and endometrial polyp.  SURGEON:  Pershing Cox, M.D.  ANESTHESIA:  MAC plus Marcaine paracervical block.  INDICATIONS:  The patient is a 60 year old female who has been followed in our office for four months.  She presented for evaluation for annual examination and gave a history of irregular __________ DD:  10/31/99 TD:  11/02/99 Job: [id number]

## 2011-04-16 ENCOUNTER — Other Ambulatory Visit: Payer: Self-pay | Admitting: Gastroenterology

## 2011-05-11 ENCOUNTER — Other Ambulatory Visit (HOSPITAL_COMMUNITY)
Admission: RE | Admit: 2011-05-11 | Discharge: 2011-05-11 | Disposition: A | Discharge status: Home / Self Care | Payer: BC Managed Care – PPO | Source: Ambulatory Visit | Attending: Obstetrics and Gynecology | Admitting: Obstetrics and Gynecology

## 2011-05-11 ENCOUNTER — Other Ambulatory Visit: Payer: Self-pay | Admitting: Obstetrics and Gynecology

## 2011-05-11 DIAGNOSIS — Z01419 Encounter for gynecological examination (general) (routine) without abnormal findings: Secondary | ICD-10-CM | POA: Insufficient documentation

## 2012-02-06 ENCOUNTER — Other Ambulatory Visit: Payer: Self-pay | Admitting: Family Medicine

## 2012-02-06 ENCOUNTER — Ambulatory Visit
Admission: RE | Admit: 2012-02-06 | Discharge: 2012-02-06 | Disposition: A | Discharge status: Home / Self Care | Payer: BC Managed Care – PPO | Source: Ambulatory Visit | Attending: Family Medicine | Admitting: Family Medicine

## 2012-02-06 DIAGNOSIS — M79609 Pain in unspecified limb: Secondary | ICD-10-CM

## 2012-04-02 ENCOUNTER — Encounter: Payer: Self-pay | Admitting: Cardiology

## 2012-04-02 ENCOUNTER — Ambulatory Visit
Admission: RE | Admit: 2012-04-02 | Discharge: 2012-04-02 | Disposition: A | Discharge status: Home / Self Care | Payer: BC Managed Care – PPO | Source: Ambulatory Visit | Attending: Gastroenterology | Admitting: Gastroenterology

## 2012-04-02 ENCOUNTER — Other Ambulatory Visit: Payer: Self-pay | Admitting: Gastroenterology

## 2012-04-02 DIAGNOSIS — K5900 Constipation, unspecified: Secondary | ICD-10-CM

## 2012-04-02 DIAGNOSIS — R140 Abdominal distension (gaseous): Secondary | ICD-10-CM

## 2012-04-07 ENCOUNTER — Other Ambulatory Visit: Payer: Self-pay | Admitting: Obstetrics and Gynecology

## 2012-04-07 DIAGNOSIS — R1900 Intra-abdominal and pelvic swelling, mass and lump, unspecified site: Secondary | ICD-10-CM

## 2012-04-09 ENCOUNTER — Ambulatory Visit
Admission: RE | Admit: 2012-04-09 | Discharge: 2012-04-09 | Disposition: A | Discharge status: Home / Self Care | Payer: BC Managed Care – PPO | Source: Ambulatory Visit | Attending: Obstetrics and Gynecology | Admitting: Obstetrics and Gynecology

## 2012-04-09 DIAGNOSIS — R1900 Intra-abdominal and pelvic swelling, mass and lump, unspecified site: Secondary | ICD-10-CM

## 2012-04-09 MED ORDER — IOHEXOL 300 MG/ML  SOLN
100.0000 mL | Freq: Once | INTRAMUSCULAR | Status: AC | PRN
  Administered 2012-04-09: 100 mL via INTRAVENOUS

## 2012-10-24 ENCOUNTER — Other Ambulatory Visit: Payer: Self-pay

## 2013-12-29 ENCOUNTER — Other Ambulatory Visit: Payer: Self-pay | Admitting: Dermatology

## 2014-12-27 ENCOUNTER — Emergency Department (HOSPITAL_COMMUNITY)
Admission: EM | Admit: 2014-12-27 | Discharge: 2014-12-27 | Disposition: A | Discharge status: Home / Self Care | Payer: BLUE CROSS/BLUE SHIELD | Attending: Emergency Medicine | Admitting: Emergency Medicine

## 2014-12-27 ENCOUNTER — Encounter (HOSPITAL_COMMUNITY): Payer: Self-pay

## 2014-12-27 DIAGNOSIS — L539 Erythematous condition, unspecified: Secondary | ICD-10-CM

## 2014-12-27 DIAGNOSIS — Z79899 Other long term (current) drug therapy: Secondary | ICD-10-CM | POA: Insufficient documentation

## 2014-12-27 DIAGNOSIS — M170 Bilateral primary osteoarthritis of knee: Secondary | ICD-10-CM | POA: Diagnosis not present

## 2014-12-27 DIAGNOSIS — Z8543 Personal history of malignant neoplasm of ovary: Secondary | ICD-10-CM | POA: Insufficient documentation

## 2014-12-27 DIAGNOSIS — J45909 Unspecified asthma, uncomplicated: Secondary | ICD-10-CM | POA: Insufficient documentation

## 2014-12-27 DIAGNOSIS — R2242 Localized swelling, mass and lump, left lower limb: Secondary | ICD-10-CM | POA: Diagnosis not present

## 2014-12-27 DIAGNOSIS — M7989 Other specified soft tissue disorders: Secondary | ICD-10-CM

## 2014-12-27 DIAGNOSIS — F329 Major depressive disorder, single episode, unspecified: Secondary | ICD-10-CM | POA: Insufficient documentation

## 2014-12-27 HISTORY — DX: Malignant (primary) neoplasm, unspecified: C80.1

## 2014-12-27 MED ORDER — ENOXAPARIN SODIUM 80 MG/0.8ML ~~LOC~~ SOLN
1.0000 mg/kg | Freq: Once | SUBCUTANEOUS | Status: AC
  Administered 2014-12-27: 65 mg via SUBCUTANEOUS
  Filled 2014-12-27: qty 0.8

## 2014-12-27 MED ORDER — SULFAMETHOXAZOLE-TRIMETHOPRIM 800-160 MG PO TABS: 1.0000 | ORAL_TABLET | Freq: Two times a day (BID) | ORAL | Status: DC

## 2014-12-27 NOTE — ED Notes (Signed)
Pt at cancer center for support meeting.  Pt noticed leg starting to swell in left thigh.  No pain.  No injury.  Feels tight.

## 2014-12-27 NOTE — ED Provider Notes (Signed)
CSN: [id number]     Arrival date & time 12/27/14  1816 History   First MD Initiated Contact with Patient 12/27/14 2102     Chief Complaint  Patient presents with  . Leg Swelling     (Consider location/radiation/quality/duration/timing/severity/associated sxs/prior Treatment) HPI Comments: Anna Kennedy is a 64 y.o. female with a PMHx of knee arthritis, depression, asthma, and ovarian cancer, who presents to the ED with complaints of left thigh swelling and tightness that she noticed around 5:30 PM today while she was in her oncology support group meeting. She reports no pain but states that the thigh and leg feel tight, with erythema and warmth located to the posterior distal thigh. She denies any injury, fevers, chills, chest pain, shortness breath, wheezing, hemoptysis, cough, recent travel/surgery/immobilization, abdominal pain, nausea, vomiting, diarrhea, dysuria, hematuria, numbness, tingling, weakness, lightheadedness, or syncope. She denies any family or personal history of DVT/PE.  Per chart review, pt sees [facility]. Her oncologic history is as follows (per note from 12/23/14 by Dr. Theora Gianotti): Oncologic History: presented to Dr. Polly Cobia in 2013 for a pelvic mass and elevated CA-125.  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 She underwent 6 cycles of Carboplatin/Taxol which she completed in 10/2012. Her CA-125 decreased from 2622 preoperatively to 8.6 in 10/2012 and a CT scan on 11/06/2012 showed no evidence of metastatic disease. The patient was documented with a biochemical recurrence in 04/2013. At that time her CT was normal and she was started on Tamoxifen. After 2 months, her CA-125 was 130 (07/2012). She was then offered and completed 6 cycles of carboplatin/Doxil. Treatment was completed in 12/2013. Again a posttreatment CT scan was negative and her CA-125  normalized to 18.  -04/29/2014: CA-125 was elevated to 3500. CT on 04/30/2014 revealed new left supraclavicular adenopathy, new carcinomatosis with peritoneal thickening and ascites, new retroperitoneal abdominal adenopathy, enlarged left common iliac node and stable small lung nodules which were previously present. Paracentesis on 05/03/14 removed 950 mL fluid. She was admitted on 05/06/14 for constipation. After multiple suppositories and bowel regimen she had several large BMs and was feeling much better. During that hospitalization she had an additional paracentesis for 1.5L.  06/03/14: Treatment on the CORAIL study was started. 05/27/2014 Ca-125 4494.9 She was randomized to receive Lurbinectidin. Improved scan on 09/24/14.  11/17/2014: Progression of disease on CT and CA-125. Discontinued treatment on CORAIL.  11/26/2014: consented for DUSKA, first treatment 7/21 with gemcitabine only Genetic testing: BRCA 1 and 2 negative   Patient is a 64 y.o. female presenting with general illness. The history is provided by the patient. No language interpreter was used.  Illness Location:  L thigh/leg swelling, erythema, and warmth Quality:  Swelling/tightness Severity:  Mild Onset quality:  Sudden Duration:  4 hours Timing:  Constant Progression:  Unchanged Chronicity:  New Context:  Active cancer Relieved by:  None tried Worsened by:  None tried Ineffective treatments:  None tried Associated symptoms: no abdominal pain, no chest pain, no cough, no diarrhea, no fever, no myalgias, no nausea, no shortness of breath and no vomiting   Risk factors:  Active cancer pt   Past Medical History  Diagnosis Date  . Retinal tear     treated previously with laser surgery  . Atypical chest pain   . Other social stressor   . Osteoarthritis of both knees   . History of depression   . History of asthma   .  Cancer     ovarian   Past Surgical History  Procedure Laterality Date  . Knee surgery      Of her  knees  . Foot surgery      Right foot   Family History  Problem Relation Age of Onset  . Arthritis Mother   . Heart disease Father    Social History  Substance Use Topics  . Smoking status: Unknown If Ever Smoked  . Smokeless tobacco: None  . Alcohol Use: Yes   OB History    No data available     Review of Systems  Constitutional: Negative for fever and chills.  Respiratory: Negative for cough and shortness of breath.   Cardiovascular: Positive for leg swelling. Negative for chest pain.  Gastrointestinal: Negative for nausea, vomiting, abdominal pain and diarrhea.  Genitourinary: Negative for dysuria and hematuria.  Musculoskeletal: Negative for myalgias and arthralgias.  Skin: Positive for color change (warmth, erythema, and swelling to L thigh/leg).  Allergic/Immunologic: Positive for immunocompromised state (on chemo).  Neurological: Negative for weakness, light-headedness and numbness.  Psychiatric/Behavioral: Negative for confusion.   10 Systems reviewed and are negative for acute change except as noted in the HPI.    Allergies  Review of patient's allergies indicates no known allergies.  Home Medications   Prior to Admission medications   Medication Sig Start Date End Date Taking? Authorizing Provider  buPROPion (WELLBUTRIN XL) 300 MG 24 hr tablet Take 300 mg by mouth daily.    Historical Provider, MD  Cetirizine HCl (ZYRTEC PO) Take by mouth as needed.    Historical Provider, MD  Cholecalciferol (VITAMIN D PO) Take by mouth daily.    Historical Provider, MD  GLUCOSAMINE PO Take by mouth as directed.    Historical Provider, MD  MAGNESIUM PO Take by mouth as directed.    Historical Provider, MD  Montelukast Sodium (SINGULAIR PO) Take by mouth as needed.    Historical Provider, MD  Multiple Vitamin (MULTIVITAMIN) tablet Take 1 tablet by mouth daily.    Historical Provider, MD   BP 117/60 mmHg  Pulse 72  Temp(Src) 97.7 F (36.5 C) (Oral)  Resp 15  Ht 5' 9"   (1.753 m)  Wt 146 lb (66.225 kg)  BMI 21.55 kg/m2  SpO2 100%   Physical Exam  Constitutional: She is oriented to person, place, and time. Vital signs are normal. She appears well-developed and well-nourished.  Non-toxic appearance. No distress.  Afebrile, nontoxic, NAD  HENT:  Head: Normocephalic and atraumatic.  Mouth/Throat: Oropharynx is clear and moist and mucous membranes are normal.  Eyes: Conjunctivae and EOM are normal. Right eye exhibits no discharge. Left eye exhibits no discharge.  Neck: Normal range of motion. Neck supple.  Cardiovascular: Normal rate, regular rhythm, normal heart sounds and intact distal pulses.  Exam reveals no gallop and no friction rub.   No murmur heard. Pulmonary/Chest: Effort normal and breath sounds normal. No respiratory distress. She has no decreased breath sounds. She has no wheezes. She has no rhonchi. She has no rales.  Abdominal: Soft. Normal appearance and bowel sounds are normal. She exhibits no distension. There is no tenderness. There is no rigidity, no rebound, no guarding, no CVA tenderness, no tenderness at McBurney's point and negative Murphy's sign.  Musculoskeletal: Normal range of motion.       Left knee: She exhibits erythema (posterior thigh). She exhibits normal range of motion, no effusion and no deformity. No tenderness found.       Left lower leg:  She exhibits edema. She exhibits no tenderness and no bony tenderness.  L knee with FROM intact, no joint line or bony TTP, no joint swelling/effusion/deformity, no bruising, with mild warmth and erythema to posterior aspect of thigh just above the knee, 1+ pitting edema to LLE, strength and sensation grossly intact, distal pulses strong and intact.  Neurological: She is alert and oriented to person, place, and time. She has normal strength. No sensory deficit.  Skin: Skin is warm, dry and intact. No rash noted. There is erythema.  Erythema and warmth to posterior L thigh without induration  or fluctuance  Psychiatric: She has a normal mood and affect.  Nursing note and vitals reviewed.   ED Course  Procedures (including critical care time) Labs Review Labs Reviewed - No data to display  Imaging Review No results found. I, Camprubi-Soms, Patty Sermons, personally reviewed and evaluated these images and lab results as part of my medical decision-making.   EKG Interpretation None      MDM   Final diagnoses:  Left leg swelling  Leg erythema    65 y.o. female here with L thigh/LE swelling and erythema with warmth that developed this afternoon when she was at her oncology support group. Currently on chemo for ovarian cancer. No pain or injury. On exam, 1+ pitting edema to LLE, with erythema and warmth to posterior L thigh just above the knee. No tenderness. Most c/w DVT, discussed that she would need U/S tomorrow to confirm and will tx empirically. Dr. Dayna Barker saw pt and agrees with this, also would like to place on abx to cover for cellulitis. Will give lovenox shot here and have her f/up in the AM for DVT study. No evidence of PE, no hypoxia or tachycardia. Will send home with bactrim. Discussed close f/up with PCP. I explained the diagnosis and have given explicit precautions to return to the ER including for any other new or worsening symptoms. The patient understands and accepts the medical plan as it's been dictated and I have answered their questions. Discharge instructions concerning home care and prescriptions have been given. The patient is STABLE and is discharged to home in good condition.  BP 111/70 mmHg  Pulse 63  Temp(Src) 97.7 F (36.5 C) (Oral)  Resp 16  Ht 5' 9"  (1.753 m)  Wt 146 lb (66.225 kg)  BMI 21.55 kg/m2  SpO2 97%  Meds ordered this encounter  Medications  . enoxaparin (LOVENOX) injection 65 mg    Sig:   . sulfamethoxazole-trimethoprim (BACTRIM DS,SEPTRA DS) 800-160 MG per tablet    Sig: Take 1 tablet by mouth 2 (two) times daily.     Dispense:  14 tablet    Refill:  0    Order Specific Question:  Supervising Provider    Answer:  Noemi Chapel [[id number]]       Lorna Strother Camprubi-Soms, PA-C 12/27/14 2246  Merrily Pew, MD 12/27/14 [phone]

## 2014-12-27 NOTE — ED Provider Notes (Signed)
Medical screening examination/treatment/procedure(s) were conducted as a shared visit with non-physician practitioner(s) and myself.  I personally evaluated the patient during the encounter.  64 year old female currently getting chemotherapy secondary to ovarian cancer presents immersed department today for acute onset of left leg erythema and swelling without any pain, fevers, nausea, and pain. Exam consistent with the same. She has swelling in that left lower leg above the knee with some splotchy erythema but no tenderness, induration, fluctuance. No calf tenderness on exam either. He is most consistent with likely DVT so we will get an outpatient ultrasound for tomorrow. Give her dose of Lovenox tonight. She does have any chest pain, shortness of breath, syncopal episodes doubt PE at this time also with normal vital signs. She will return here for onset of any of those symptoms.  I have personally and contemperaneously reviewed labs and imaging and used in my decision making as above.   A medical screening exam was performed and I feel the patient has had an appropriate workup for their chief complaint at this time and likelihood of emergent condition existing is low. They have been counseled on decision, discharge, follow up and which symptoms necessitate immediate return to the emergency department. They or their family verbally stated understanding and agreement with plan and discharged in stable condition.     Merrily Pew, MD 12/27/14 [phone]

## 2014-12-27 NOTE — Discharge Instructions (Signed)
Your leg swelling and redness/warmth could be from a blood clot in your thigh. You've been given one dose of blood thinner and you will need to have an ultrasound done tomorrow morning at [facility] (see instructions below) to confirm if this is present. The ultrasound personnel will tell you if you have a clot or not. If you do, you will need blood thinners; if not, you will need to start antibiotics for potential skin infection and you will need to follow up with your regular doctor closely. Call your primary doctor tomorrow and schedule an appointment. Return to the ER for changes or worsening symptoms.  IMPORTANT PATIENT INSTRUCTIONS:  You have been scheduled for an Outpatient Vascular Study at [facility].   If tomorrow is a Saturday or Sunday, please go to the [facility] Emergency Department Registration Desk at 8 am tomorrow morning and tell them you are there for a vascular study.  If tomorrow is a weekday (Monday-Friday), please go to [facility] Admitting Department at 8 am and tell them you are  there for a vascular study.   Peripheral Edema You have swelling in your legs (peripheral edema). This swelling is due to excess accumulation of salt and water in your body. Edema may be a sign of heart, kidney or liver disease, or a side effect of a medication. It may also be due to problems in the leg veins. Elevating your legs and using special support stockings may be very helpful, if the cause of the swelling is due to poor venous circulation. Avoid long periods of standing, whatever the cause. Treatment of edema depends on identifying the cause. Chips, pretzels, pickles and other salty foods should be avoided. Restricting salt in your diet is almost always needed. Water pills (diuretics) are often used to remove the excess salt and water from your body via urine. These medicines prevent the kidney from reabsorbing sodium. This increases urine flow. Diuretic treatment may also result  in lowering of potassium levels in your body. Potassium supplements may be needed if you have to use diuretics daily. Daily weights can help you keep track of your progress in clearing your edema. You should call your caregiver for follow up care as recommended. SEEK IMMEDIATE MEDICAL CARE IF:   You have increased swelling, pain, redness, or heat in your legs.  You develop shortness of breath, especially when lying down.  You develop chest or abdominal pain, weakness, or fainting.  You have a fever. Document Released: 06/07/2004 Document Revised: 07/23/2011 Document Reviewed: 05/18/2009 [facility] Patient Information 2015 [place], [place]. This information is not intended to replace advice given to you by your health care provider. Make sure you discuss any questions you have with your health care provider.

## 2014-12-28 ENCOUNTER — Ambulatory Visit (HOSPITAL_COMMUNITY)
Admission: RE | Admit: 2014-12-28 | Discharge: 2014-12-28 | Disposition: A | Discharge status: Home / Self Care | Payer: BLUE CROSS/BLUE SHIELD | Source: Ambulatory Visit | Attending: Physician Assistant | Admitting: Physician Assistant

## 2014-12-28 DIAGNOSIS — M7989 Other specified soft tissue disorders: Secondary | ICD-10-CM | POA: Diagnosis not present

## 2014-12-28 DIAGNOSIS — M79605 Pain in left leg: Secondary | ICD-10-CM | POA: Insufficient documentation

## 2014-12-28 NOTE — Progress Notes (Addendum)
*  Preliminary Results* Left lower extremity venous duplex completed. Left lower extremity is negative for deep vein thrombosis. There is evidence of left Baker's cyst.  12/28/2014 8:47 AM  Maudry Mayhew, RVT, RDCS, RDMS

## 2015-01-24 ENCOUNTER — Ambulatory Visit
Admission: RE | Admit: 2015-01-24 | Discharge: 2015-01-24 | Disposition: A | Discharge status: Home / Self Care | Payer: BLUE CROSS/BLUE SHIELD | Source: Ambulatory Visit | Attending: Family Medicine | Admitting: Family Medicine

## 2015-01-24 ENCOUNTER — Other Ambulatory Visit: Payer: Self-pay | Admitting: Family Medicine

## 2015-01-24 DIAGNOSIS — L0390 Cellulitis, unspecified: Secondary | ICD-10-CM

## 2015-07-14 ENCOUNTER — Ambulatory Visit: Payer: BLUE CROSS/BLUE SHIELD | Attending: Family Medicine | Admitting: Physical Therapy

## 2015-07-14 ENCOUNTER — Encounter: Payer: Self-pay | Admitting: Physical Therapy

## 2015-07-14 DIAGNOSIS — I890 Lymphedema, not elsewhere classified: Secondary | ICD-10-CM

## 2015-07-14 NOTE — Therapy (Signed)
[place], [place], [address] Phone: [phone]   Fax:  [phone]  Physical Therapy Evaluation  Patient Details  Name: Anna Kennedy MRN: [id number] Date of Birth: May 16, 1950 Referring Provider: Darcus Austin  Encounter Date: 07/14/2015      PT End of Session - 07/14/15 1152    Visit Number 1   Number of Visits 16   Date for PT Re-Evaluation 09/08/15   PT Start Time 0810   PT Stop Time 0855   PT Time Calculation (min) 45 min   Activity Tolerance Patient tolerated treatment well   Behavior During Therapy [facility] for tasks assessed/performed      Past Medical History  Diagnosis Date  . Retinal tear     treated previously with laser surgery  . Atypical chest pain   . Other social stressor   . Osteoarthritis of both knees   . History of depression   . History of asthma   . Cancer ([place])     ovarian    Past Surgical History  Procedure Laterality Date  . Knee surgery      Of her knees  . Foot surgery      Right foot    There were no vitals filed for this visit.  Visit Diagnosis:  Lymphedema of both lower extremities - Plan: PT plan of care cert/re-cert      Subjective Assessment - 07/14/15 0815    Subjective I am going to [facility] for ovarian cancer treatment. I met with a therapist there that taught me how to do lymphedema massage. It started getting worse during the last month or so. I have been wearing compression hose that are 15-72mHg. I work part time and it is hard for me to sit for long periods of time. By the end of the day they are very uncomfortable. I have not  had compression bandaging. I have edema in my abdomen from ascites fluid buildup.    Pertinent History Was diagnosed Nov 25,2013 with ovarian cancer. Pt underwent a total hysterectomy in Dec 2013 at that time no lymph nodes were involved. Pt is recieving chemotherapy infusions but did not require radiation. Now one lymph node  is involved in right groin and pt has edema of  bilateral LEs with right worse than left. They initially thought it was cellulitis and she was hospitilized but later pt learned it was lymphedema. Pt has never had a DVT.   How long can you sit comfortably? approx 45 min to an hour, pt tries to get up every 20 minutes or so at work   Patient Stated Goals to get LE swelling under control and get some relief   Currently in Pain? No/denies            [facility] - 07/14/15 0001    Assessment   Medical Diagnosis lymphedema   Referring Provider DDarcus Austin  Onset Date/Surgical Date 04/22/12   Hand Dominance Right   Prior Therapy [facility]  Precautions   Precautions Other (comment)  bilateral LE lymphedema   Restrictions   Weight Bearing Restrictions No   Balance Screen   Has the patient fallen in the past 6 months No   Has the patient had a decrease in activity level because of a fear of falling?  No   Is the patient reluctant to leave their home because of a fear of falling?  No  Home Environment   Living Environment Private residence   Living Arrangements Spouse/significant other   Available Help at Discharge Family   Type of [place] to enter   Entrance Stairs-Number of Steps 3   Entrance Stairs-Rails None   Home Layout Two level;Able to live on main level with bedroom/bathroom   Home Equipment None   Prior Function   Level of Independence Independent   Vocation Part time employment   Vocation Requirements sititng on the computer, pt is a Herbalist   Leisure pt tries to go for a walk daily, 15 min walk daily   Cognition   Overall Cognitive Status Within Functional Limits for tasks assessed   Observation/Other Assessments   Observations Lymphedema Life Impact Scale Impairment Score: 41%           LYMPHEDEMA/ONCOLOGY QUESTIONNAIRE - 07/14/15 0826    Type   Cancer Type ovarian cancer   Surgeries    Other Surgery Date 04/22/12  total hysterectomy   Treatment   Active Chemotherapy Treatment Yes   Date 05/28/12   Active Radiation Treatment No   Past Radiation Treatment No   Current Hormone Treatment No   Past Hormone Therapy No   What other symptoms do you have   Are you Having Heaviness or Tightness Yes   Are you having Pain No   Are you having pitting edema Yes   Body Site bilateral LEs   Is it Hard or Difficult finding clothes that fit Yes   Do you have infections No   Right Lower Extremity Lymphedema   At Groin Measure at Horizontal from Pubic Bone 59.5 cm   30 cm Proximal to Suprapatella 58 cm   20 cm Proximal to Suprapatella 51.6 cm   10 cm Proximal to Suprapatella 47.4 cm   At Midpatella/Popliteal Crease 41.3 cm   30 cm Proximal to Floor at Lateral Plantar Foot 38 cm   20 cm Proximal to Floor at Lateral Plantar Foot 28._0 cm Proximal to Floor at Lateral Malleoli 24.5 cm   Circumference of ankle/heel 33.6 cm.   5 cm Proximal to 1st MTP Joint 22 cm   Across MTP Joint 24.2 cm   Around Proximal Great Toe 7.8 cm   Left Lower Extremity Lymphedema   At Groin Measure at Horizontal from Pubic Bone 56.1 cm   30 cm Proximal to Suprapatella 56.8 cm   20 cm Proximal to Suprapatella 50.7 cm   10 cm Proximal to Suprapatella 45.9 cm   At Midpatella/Popliteal Crease 41.2 cm   30 cm Proximal to Floor at Lateral Plantar Foot 34.5 cm   20 cm Proximal to Floor at Lateral Plantar Foot 27.1 cm   10 cm Proximal to Floor at Lateral Malleoli 23.6 cm   Circumference of ankle/heel 31.6 cm.   5 cm Proximal to 1st MTP Joint 20.7 cm   Across MTP Joint 23 cm   Around Proximal Great Toe 7.5 cm                        PT Education - 07/14/15 0852    Education provided Yes   Education Details anatomy and physiology of the lymphatic system, benefits of compression bandaging   Person(s) Educated Patient   Methods Explanation   Comprehension Verbalized understanding            Short Term Clinic Goals - 07/14/15 1159    CC Short  Term Goal  #1   Title Pt to demonstrate a 3 cm decrease of right thigh edema (20 cm above suprapatella) to decrease risk of cellulitis   Baseline 51.6 cm   Time 4   Period Weeks   Status New   CC Short Term Goal  #2   Title Pt to demonstrate a 2 cm decrease of right calf edema (30cm proximal to floor) to decrease risk of cellulitis   Baseline 38 cm   Time 4   Period Weeks   Status New             Long Term Clinic Goals - 07/14/15 1203    CC Long Term Goal  #1   Title Pt to demonstrate a 6 cm decrease of right thigh edema (20 cm above suprapatella) to decrease risk of cellulitis   Baseline 51.6 cm   Time 8   Period Weeks   Status New   CC Long Term Goal  #2   Title Pt to demonstrate a 4 cm decrease of right calf edema (30cm proximal to floor) to decrease risk of cellulitis   Baseline 38   Time 8   Period Weeks   Status New   CC Long Term Goal  #3   Title Pt to be receive with appropriate compression garments for day and night time use for long term management of edema   Baseline none   Time 8   Period Weeks   Status New   CC Long Term Goal  #4   Title Pt to verbalize signs/symptoms of infection in bilateral LEs and lymphedema precautions   Baseline none   Time 8   Period Weeks   Status New            Plan - 07/14/15 1153    Clinical Impression Statement Pt is currently undergoing chemotherapy treatments for ovarian cancer. She began noticing increased bilateral LE edema when she was taken off of one chemotherapy drug secondary to anemia. Pt was off of chemo for one month and during this time she reports her legs got much larger. She restarted a different chemotherapy drug on Jun 17, 2015. Pt went to several therapy appointments at [facility] to learn self MLD and has been wearing 15-53mHg thigh high compression stockings. She reports that this is no longer managing her edema. She also reports she has  abdominal edema from ascites. Her edema is limiting her ability to sit for long periods of time and pt has to get up frequently. Pt would benefit from skilled PT services to decrease bilateral LE edema and get pt into new compression garments to better manage her swelling.    Pt will benefit from skilled therapeutic intervention in order to improve on the following deficits Increased edema   Rehab Potential Excellent   Clinical Impairments Affecting Rehab Potential pt goes for chemotherapy infusions   PT Frequency 2x / week   PT Duration 8 weeks   PT Treatment/Interventions Vasopneumatic Device;Manual lymph drainage;Compression bandaging;Therapeutic exercise   PT Next Visit Plan begin MLD for bilateral LEs, begin with compression bandaging for RLE   Consulted and Agree with Plan of Care Patient         Problem List Patient Active Problem List   Diagnosis Date Noted  . POSTNASAL DRIP 08/23/2009  . ASTHMA 08/01/2009  . ARTHRITIS 08/01/2009  . SHORTNESS OF BREATH (SOB) 08/01/2009    BAlexia Freestone3/06/2015, 12:22 PM  [place]  [place], [place], [address] Phone: [phone]   Fax:  [phone]  Name: Anna Kennedy MRN: [id number] Date of Birth: [birth year]   Allyson Sabal, PT 07/14/2015 12:22 PM

## 2015-07-18 ENCOUNTER — Ambulatory Visit: Payer: BLUE CROSS/BLUE SHIELD | Admitting: Physical Therapy

## 2015-07-18 ENCOUNTER — Encounter: Payer: Self-pay | Admitting: Physical Therapy

## 2015-07-18 DIAGNOSIS — I890 Lymphedema, not elsewhere classified: Secondary | ICD-10-CM | POA: Diagnosis not present

## 2015-07-18 NOTE — Therapy (Signed)
[place], [place], [address] Phone: [phone]   Fax:  [phone]  Physical Therapy Treatment  Patient Details  Name: Anna Kennedy MRN: [id number] Date of Birth: November 14, 1950 Referring Provider: Darcus Austin  Encounter Date: 07/18/2015      PT End of Session - 07/18/15 1618    Visit Number 2   Number of Visits 16   Date for PT Re-Evaluation 09/08/15   PT Start Time 1300   PT Stop Time 1354   PT Time Calculation (min) 54 min   Activity Tolerance Patient tolerated treatment well   Behavior During Therapy [facility] for tasks assessed/performed      Past Medical History  Diagnosis Date  . Retinal tear     treated previously with laser surgery  . Atypical chest pain   . Other social stressor   . Osteoarthritis of both knees   . History of depression   . History of asthma   . Cancer ([place])     ovarian    Past Surgical History  Procedure Laterality Date  . Knee surgery      Of her knees  . Foot surgery      Right foot    There were no vitals filed for this visit.  Visit Diagnosis:  Lymphedema of both lower extremities      Subjective Assessment - 07/18/15 1440    Subjective I don't know that I'm really prepared for this.  I'm owrried my shoes and pants won't fit.  I didn't get a good idea at my eval of what this was going to entail.  I feel a little anxious about it.   Pertinent History Was diagnosed Nov 25,2013 with ovarian cancer. Pt underwent a total hysterectomy in Dec 2013 at that time no lymph nodes were involved. Pt is recieving chemotherapy infusions but did not require radiation. Now one lymph node is involved in right groin and pt has edema of  bilateral LEs with right worse than left. They initially thought it was cellulitis and she was hospitilized but later pt learned it was lymphedema. Pt has never had a DVT.   Patient Stated Goals to get LE swelling under control and get some  relief   Currently in Pain? No/denies            [facility] PT Assessment - 07/18/15 0001    Precautions   Precaution Comments No abdominal MLD secondary to hernias / ascites                     [facility] Adult PT Treatment/Exercise - 07/18/15 0001    Manual Therapy   Manual Therapy Manual Lymphatic Drainage (MLD);Compression Bandaging   Manual Lymphatic Drainage (MLD) Manual lymph drainage to BLE in supine as PT explained the treatment and sequelae of what complete decongestive therapy entails: short neck, bilateral axillae, bilateral inguino-axillary pathways staying lateral due to abdominal ascites; Left LE lateral hip and then medial to lateral hip redirecting along pathway; left lower leg and ankle/dorsal foot redirecting along lateral hip and then pathways; repeated process with right LE; ended with bilateral axillae and short neck.  Avoided abdominal region due to ascites.   Compression Bandaging To right LE: lotion; stockinette from foot to groin; artiflex focused mostly on right foot, ankle and knee; 1-8 cm, 2-10 cm, and 5-12 cm short stretch compression bandages from foot to groin.  Short Term Clinic Goals - 07/14/15 1159    CC Short Term Goal  #1   Title Pt to demonstrate a 3 cm decrease of right thigh edema (20 cm above suprapatella) to decrease risk of cellulitis   Baseline 51.6 cm   Time 4   Period Weeks   Status New   CC Short Term Goal  #2   Title Pt to demonstrate a 2 cm decrease of right calf edema (30cm proximal to floor) to decrease risk of cellulitis   Baseline 38 cm   Time 4   Period Weeks   Status New             Long Term Clinic Goals - 07/14/15 1203    CC Long Term Goal  #1   Title Pt to demonstrate a 6 cm decrease of right thigh edema (20 cm above suprapatella) to decrease risk of cellulitis   Baseline 51.6 cm   Time 8   Period Weeks   Status New   CC Long Term Goal  #2   Title Pt to demonstrate a 4 cm  decrease of right calf edema (30cm proximal to floor) to decrease risk of cellulitis   Baseline 38   Time 8   Period Weeks   Status New   CC Long Term Goal  #3   Title Pt to be receive with appropriate compression garments for day and night time use for long term management of edema   Baseline none   Time 8   Period Weeks   Status New   CC Long Term Goal  #4   Title Pt to verbalize signs/symptoms of infection in bilateral LEs and lymphedema precautions   Baseline none   Time 8   Period Weeks   Status New            Plan - 07/18/15 1612    Clinical Impression Statement Pt had many questions about her prognosis related to lymphedema and concerns about being bandaged.  Those were all addressed and pt left feeling comfortable with the plan.  Her abdomen was avoided with manual lymph drainage due to ascites but her legs softened and loosened after treatment.  Her right leg was bandaged and she donned her compression stocking for the left leg.  We discussed the option of bandaging both simultaneously but agreed it was not a good idea as her function and safety would be further limited.  She is going on a trip the last week of March so she is hopeful to complete her right leg treatment before her trip and then begin her left when she returns.   Pt will benefit from skilled therapeutic intervention in order to improve on the following deficits Increased edema   Rehab Potential Good   Clinical Impairments Affecting Rehab Potential pt goes for chemotherapy infusions   PT Frequency 2x / week   PT Duration 8 weeks   PT Treatment/Interventions Vasopneumatic Device;Manual lymph drainage;Compression bandaging;Therapeutic exercise   PT Next Visit Plan MLD for bilateral LEs, continue compression bandaging for RLE; will try to increase visit time to 90 minutes when able   Consulted and Agree with Plan of Care Patient        Problem List Patient Active Problem List   Diagnosis Date Noted  .  POSTNASAL DRIP 08/23/2009  . ASTHMA 08/01/2009  . ARTHRITIS 08/01/2009  . SHORTNESS OF BREATH (SOB) 08/01/2009    Annia Friendly, PT 07/18/2015 4:20 PM  [facility]  [place], [place], [address] Phone: [phone]   Fax:  [phone]  Name: Anna Kennedy MRN: [id number] Date of Birth: Jun 30, 1950

## 2015-07-20 ENCOUNTER — Ambulatory Visit: Payer: BLUE CROSS/BLUE SHIELD

## 2015-07-20 DIAGNOSIS — I890 Lymphedema, not elsewhere classified: Secondary | ICD-10-CM | POA: Diagnosis not present

## 2015-07-20 NOTE — Patient Instructions (Signed)
Cancer Rehab [phone] 5 Circles above the clavicles  Deep Effective Breath   Standing, sitting, or laying down take a deep breath IN, expanding the belly; then breath OUT, contracting the belly. Repeat __5__ times. Do __2-3__ sessions per day and before each self massage.  http://gt2.exer.us/866   Copyright  VHI. All rights reserved.  Inguinal Nodes to Axilla - Clear   On involved side, at armpit, make _5__ in-place circles. Then from hip proceed in sections to armpit with stationary circles or pumps _5_ times, this is your pathway. Do _1__ time per day.  Copyright  VHI. All rights reserved.  LEG: Knee to Hip - Clear   Pump up outer thigh of involved leg from knee to outer hip. Then do stationary circles from inner to outer thigh, then do outer thigh again. Next, interlace fingers behind knee IF ABLE and make in-place circles. Do _5_ times of each sequence.  Do _1__ time per day.  Copyright  VHI. All rights reserved.  LEG: Ankle to Hip Sweep   Hands on sides of ankle of involved leg, pump _5__ times up both sides of lower leg, then retrace steps up outer thigh to hip as before and back to pathway. Do _2-3_ times. Do __1_ time per day.  Copyright  VHI. All rights reserved.  FOOT: Dorsum of Foot and Toes Massage   One hand on top of foot make _5_ stationary circles or pumps, then either on top of toes or each individual toe do _5_ pumps. Then retrace all steps pumping back up both sides of lower leg, outer thigh, and then pathway. Finish with what you started with, _5_ circles at involved side arm pit. All _2-3_ times at each sequence. Do _1__ time per day.  Copyright  VHI. All rights reserved.

## 2015-07-20 NOTE — Therapy (Signed)
[place], [place], [address] Phone: [phone]   Fax:  [phone]  Physical Therapy Treatment  Patient Details  Name: Anna Kennedy MRN: [id number] Date of Birth: Nov 09, 1950 Referring Provider: Darcus Austin  Encounter Date: 07/20/2015      PT End of Session - 07/20/15 1719    Visit Number 3   Number of Visits 16   Date for PT Re-Evaluation 09/08/15   PT Start Time 4270   PT Stop Time 1524   PT Time Calculation (min) 97 min   Activity Tolerance Patient tolerated treatment well   Behavior During Therapy [facility] for tasks assessed/performed      Past Medical History  Diagnosis Date  . Retinal tear     treated previously with laser surgery  . Atypical chest pain   . Other social stressor   . Osteoarthritis of both knees   . History of depression   . History of asthma   . Cancer ([place])     ovarian    Past Surgical History  Procedure Laterality Date  . Knee surgery      Of her knees  . Foot surgery      Right foot    There were no vitals filed for this visit.  Visit Diagnosis:  Lymphedema of both lower extremities      Subjective Assessment - 07/20/15 1350    Subjective Bandages slipped down some from thigh and tried to rewrap them but not sure if I did it right. Overall though they were tolerable. Feels like some of the fluid went to my abdomen.    Pertinent History Was diagnosed Nov 25,2013 with ovarian cancer. Pt underwent a total hysterectomy in Dec 2013 at that time no lymph nodes were involved. Pt is recieving chemotherapy infusions but did not require radiation. Now one lymph node is involved in right groin and pt has edema of  bilateral LEs with right worse than left. They initially thought it was cellulitis and she was hospitilized but later pt learned it was lymphedema. Pt has never had a DVT.   Patient Stated Goals to get LE swelling under control and get some relief   Currently  in Pain? No/denies               LYMPHEDEMA/ONCOLOGY QUESTIONNAIRE - 07/20/15 1351    Right Lower Extremity Lymphedema   At Groin Measure at Horizontal from Pubic Bone 59.5 cm   30 cm Proximal to Suprapatella 60.7 cm   20 cm Proximal to Suprapatella 54.5 cm   10 cm Proximal to Suprapatella 44.3 cm   At Midpatella/Popliteal Crease 39.6 cm   30 cm Proximal to Floor at Lateral Plantar Foot 36.2 cm   20 cm Proximal to Floor at Lateral Plantar Foot 25.9 1   10  cm Proximal to Floor at Lateral Malleoli 22.3 cm   Circumference of ankle/heel 31.5 cm.   5 cm Proximal to 1st MTP Joint 21.5 cm   Across MTP Joint 22.8 cm   Around Proximal Great Toe 7.8 cm                  [facility] Adult PT Treatment/Exercise - 07/20/15 0001    Manual Therapy   Manual therapy comments Assisted pt with doffing and donning Lt LE compression stocking butnoted a tear  and she reported she would be getting new ones soon. Also showed pt how to easier don by turning garment inside out above the heel.  Edema Management Made chip pack for pt to wear to help address her genitalia swelling.   Manual Lymphatic Drainage (MLD) Manual lymph drainage to BLE in supine: short neck, bilateral axillae, bilateral inguino-axillary pathways staying lateral due to abdominal ascites; Left LE lateral hip and then medial to lateral hip redirecting along pathway; left lower leg and ankle/dorsal foot redirecting along lateral hip and then pathways; repeated process with right LE; ended with bilateral axillae and short neck.  Avoided abdominal region due to ascites.   Compression Bandaging To right LE: lotion; stockinette from foot to groin; artiflex focused mostly on right foot, ankle and knee; 1-8 cm, 2-10 cm, and 5-12 cm short stretch compression bandages from foot to groin.                PT Education - 07/20/15 1718    Education provided Yes   Education Details Issued video for self manual lymph drainage and handout  of sequence.    Person(s) Educated Patient   Methods Explanation;Demonstration;Handout;Other (comment)  Video   Comprehension Verbalized understanding;Need further instruction           Short Term Clinic Goals - 07/20/15 1727    CC Short Term Goal  #1   Title Pt to demonstrate a 3 cm decrease of right thigh edema (20 cm above suprapatella) to decrease risk of cellulitis   Baseline 51.6 cm; 54.5 cm 07/20/15 but the bandage had slid beneath this point and pt had great reductions below this causing increase fluid above   CC Short Term Goal  #2   Title Pt to demonstrate a 2 cm decrease of right calf edema (30cm proximal to floor) to decrease risk of cellulitis   Baseline 38 cm; 36.2 cm 07/20/15   Status Partially Met             Long Term Clinic Goals - 07/14/15 1203    CC Long Term Goal  #1   Title Pt to demonstrate a 6 cm decrease of right thigh edema (20 cm above suprapatella) to decrease risk of cellulitis   Baseline 51.6 cm   Time 8   Period Weeks   Status New   CC Long Term Goal  #2   Title Pt to demonstrate a 4 cm decrease of right calf edema (30cm proximal to floor) to decrease risk of cellulitis   Baseline 38   Time 8   Period Weeks   Status New   CC Long Term Goal  #3   Title Pt to be receive with appropriate compression garments for day and night time use for long term management of edema   Baseline none   Time 8   Period Weeks   Status New   CC Long Term Goal  #4   Title Pt to verbalize signs/symptoms of infection in bilateral LEs and lymphedema precautions   Baseline none   Time 8   Period Weeks   Status New            Plan - 07/20/15 1722    Clinical Impression Statement Pt did very well with session today asking appropriate questions throughout treatment regarding sequencing of manual lymph drainage. Also issued video for her to watch, with her husband as well as she reports he could help her with this. And pt had great circumference reductions from  her evaluation and seems to be tolerating the bandages well. She plans to order 20-30 mmHg stockings she was previously measured for to have for her  trip.    Pt will benefit from skilled therapeutic intervention in order to improve on the following deficits Increased edema   Rehab Potential Good   Clinical Impairments Affecting Rehab Potential pt goes for chemotherapy infusions   PT Frequency 2x / week   PT Duration 8 weeks   PT Treatment/Interventions Vasopneumatic Device;Manual lymph drainage;Compression bandaging;Therapeutic exercise   PT Next Visit Plan MLD for bilateral LEs, continue compression bandaging for RLE; further instruct pt in self manual lymph drainage and instruct husband in both this and bandaging when he can come   PT Home Exercise Plan Self manual lymph drainage for Lt LE.   Consulted and Agree with Plan of Care Patient        Problem List Patient Active Problem List   Diagnosis Date Noted  . POSTNASAL DRIP 08/23/2009  . ASTHMA 08/01/2009  . ARTHRITIS 08/01/2009  . SHORTNESS OF BREATH (SOB) 08/01/2009    Otelia Limes, PTA 07/20/2015, 5:30 PM  [place], [place], [address] Phone: [phone]   Fax:  [phone]  Name: Anna Kennedy MRN: [id number] Date of Birth: [birth year]

## 2015-07-21 ENCOUNTER — Encounter: Payer: Self-pay | Admitting: Physical Therapy

## 2015-07-21 ENCOUNTER — Ambulatory Visit: Payer: BLUE CROSS/BLUE SHIELD | Admitting: Physical Therapy

## 2015-07-21 DIAGNOSIS — I890 Lymphedema, not elsewhere classified: Secondary | ICD-10-CM | POA: Diagnosis not present

## 2015-07-21 NOTE — Therapy (Signed)
[place], [place], [address] Phone: [phone]   Fax:  [phone]  Physical Therapy Treatment  Patient Details  Name: Anna Kennedy MRN: [id number] Date of Birth: 03-05-1951 Referring Provider: Darcus Austin  Encounter Date: 07/21/2015      PT End of Session - 07/21/15 1129    Visit Number 4   Number of Visits 16   Date for PT Re-Evaluation 09/08/15   PT Start Time 0942   PT Stop Time 1130   PT Time Calculation (min) 108 min   Activity Tolerance Patient tolerated treatment well   Behavior During Therapy [facility] for tasks assessed/performed      Past Medical History  Diagnosis Date  . Retinal tear     treated previously with laser surgery  . Atypical chest pain   . Other social stressor   . Osteoarthritis of both knees   . History of depression   . History of asthma   . Cancer ([place])     ovarian    Past Surgical History  Procedure Laterality Date  . Knee surgery      Of her knees  . Foot surgery      Right foot    There were no vitals filed for this visit.  Visit Diagnosis:  Lymphedema of both lower extremities      Subjective Assessment - 07/21/15 0945    Subjective The bandages did fine but around the top of my foot it was killing me. I kept getting up in the middle of the night and tried to stretch it. This morning I had to unwrap the foot some.    Pertinent History Was diagnosed Nov 25,2013 with ovarian cancer. Pt underwent a total hysterectomy in Dec 2013 at that time no lymph nodes were involved. Pt is recieving chemotherapy infusions but did not require radiation. Now one lymph node is involved in right groin and pt has edema of  bilateral LEs with right worse than left. They initially thought it was cellulitis and she was hospitilized but later pt learned it was lymphedema. Pt has never had a DVT.   Currently in Pain? No/denies               LYMPHEDEMA/ONCOLOGY  QUESTIONNAIRE - 07/20/15 1351    Right Lower Extremity Lymphedema   At Groin Measure at Horizontal from Pubic Bone 59.5 cm   30 cm Proximal to Suprapatella 60.7 cm   20 cm Proximal to Suprapatella 54.5 cm   10 cm Proximal to Suprapatella 44.3 cm   At Midpatella/Popliteal Crease 39.6 cm   30 cm Proximal to Floor at Lateral Plantar Foot 36.2 cm   20 cm Proximal to Floor at Lateral Plantar Foot 25._0 cm Proximal to Floor at Lateral Malleoli 22.3 cm   Circumference of ankle/heel 31.5 cm.   5 cm Proximal to 1st MTP Joint 21.5 cm   Across MTP Joint 22.8 cm   Around Proximal Great Toe 7.8 cm                  [facility] Adult PT Treatment/Exercise - 07/21/15 0001    Manual Therapy   Manual Therapy Manual Lymphatic Drainage (MLD);Compression Bandaging   Manual Lymphatic Drainage (MLD) Manual lymph drainage to BLE in supine: short neck, bilateral axillae, bilateral inguino-axillary pathways staying lateral due to abdominal ascites; Left LE lateral hip and then medial to lateral hip redirecting along pathway; left lower leg and ankle/dorsal foot  redirecting along lateral hip and then pathways; repeated process with right LE; ended with bilateral axillae and short neck.  Avoided abdominal region due to ascites.   Compression Bandaging To right LE: lotion; stockinette from foot to groin; artiflex focused mostly on right foot, ankle and knee; 1-8 cm, 2-10 cm, and 5-12 cm short stretch compression bandages from foot to groin.                PT Education - 07/21/15 1128    Education provided Yes   Education Details Difference between flat knit and circular knit   Person(s) Educated Patient   Methods Explanation   Comprehension Verbalized understanding           Short Term Clinic Goals - 07/20/15 1727    CC Short Term Goal  #1   Title Pt to demonstrate a 3 cm decrease of right thigh edema (20 cm above suprapatella) to decrease risk of cellulitis   Baseline 51.6 cm; 54.5 cm  07/20/15 but the bandage had slid beneath this point and pt had great reductions below this causing increase fluid above   CC Short Term Goal  #2   Title Pt to demonstrate a 2 cm decrease of right calf edema (30cm proximal to floor) to decrease risk of cellulitis   Baseline 38 cm; 36.2 cm 07/20/15   Status Partially Met             Long Term Clinic Goals - 07/14/15 1203    CC Long Term Goal  #1   Title Pt to demonstrate a 6 cm decrease of right thigh edema (20 cm above suprapatella) to decrease risk of cellulitis   Baseline 51.6 cm   Time 8   Period Weeks   Status New   CC Long Term Goal  #2   Title Pt to demonstrate a 4 cm decrease of right calf edema (30cm proximal to floor) to decrease risk of cellulitis   Baseline 38   Time 8   Period Weeks   Status New   CC Long Term Goal  #3   Title Pt to be receive with appropriate compression garments for day and night time use for long term management of edema   Baseline none   Time 8   Period Weeks   Status New   CC Long Term Goal  #4   Title Pt to verbalize signs/symptoms of infection in bilateral LEs and lymphedema precautions   Baseline none   Time 8   Period Weeks   Status New            Plan - 07/21/15 1130    Clinical Impression Statement Pt demonstrates increased skin mobility of RLE. Her RLE edema is reducing quickly and she is responding well to treatment. She has not worn her chip pack for genital swelling yet but plans to. She will be measured for a flat knit 30-40 for RLE next session since she is reducing so quickly. . Once she receives a garment for her RLE, compression bandaging will begin on LLE. She maintained some edema in top of right thigh where her bandages had slid down.    Pt will benefit from skilled therapeutic intervention in order to improve on the following deficits Increased edema   Rehab Potential Good   Clinical Impairments Affecting Rehab Potential pt goes for chemotherapy infusions   PT  Frequency 2x / week   PT Duration 8 weeks   PT Treatment/Interventions Vasopneumatic Device;Manual lymph drainage;Compression bandaging;Therapeutic  exercise   PT Next Visit Plan pt to be measured for compression garment next visit as a long term management solution for RLE edema, continue MLD and compression bandaging   Consulted and Agree with Plan of Care Patient        Problem List Patient Active Problem List   Diagnosis Date Noted  . POSTNASAL DRIP 08/23/2009  . ASTHMA 08/01/2009  . ARTHRITIS 08/01/2009  . SHORTNESS OF BREATH (SOB) 08/01/2009    Anna Kennedy 07/21/2015, 11:40 AM  [place], [place], [address] Phone: [phone]   Fax:  [phone]  Name: Anna Kennedy MRN: [id number] Date of Birth: 1951-03-18    Allyson Sabal, PT 07/21/2015 11:40 AM

## 2015-07-25 ENCOUNTER — Ambulatory Visit: Payer: BLUE CROSS/BLUE SHIELD | Admitting: Physical Therapy

## 2015-07-27 ENCOUNTER — Ambulatory Visit: Payer: BLUE CROSS/BLUE SHIELD | Admitting: Physical Therapy

## 2015-07-27 ENCOUNTER — Encounter: Payer: Self-pay | Admitting: Physical Therapy

## 2015-07-27 ENCOUNTER — Telehealth: Payer: Self-pay | Admitting: Physical Therapy

## 2015-07-27 DIAGNOSIS — I890 Lymphedema, not elsewhere classified: Secondary | ICD-10-CM | POA: Diagnosis not present

## 2015-07-27 NOTE — Telephone Encounter (Signed)
Called Dr. Darcus Austin office talked to [place]. She informed me that Dr. Inda Merlin nurse faxed the script, I explained to her we did not get the order and could they re- faxed. She send it to the nurse as high priority request.

## 2015-07-27 NOTE — Therapy (Signed)
[place], [place], [address] Phone: [phone]   Fax:  [phone]  Physical Therapy Treatment  Patient Details  Name: Anna Kennedy MRN: [id number] Date of Birth: Oct 08, 1950 Referring Provider: Darcus Austin  Encounter Date: 07/27/2015      PT End of Session - 07/27/15 1714    Visit Number 5   Number of Visits 16   Date for PT Re-Evaluation 09/08/15   PT Start Time 0962   PT Stop Time 1605   PT Time Calculation (min) 76 min   Activity Tolerance Patient tolerated treatment well   Behavior During Therapy [facility] for tasks assessed/performed      Past Medical History  Diagnosis Date  . Retinal tear     treated previously with laser surgery  . Atypical chest pain   . Other social stressor   . Osteoarthritis of both knees   . History of depression   . History of asthma   . Cancer ([place])     ovarian    Past Surgical History  Procedure Laterality Date  . Knee surgery      Of her knees  . Foot surgery      Right foot    There were no vitals filed for this visit.  Visit Diagnosis:  Lymphedema of both lower extremities      Subjective Assessment - 07/27/15 1447    Subjective I am sorry I missed my last appointment but I had the stomach bug and was running a fever. I unwrapped myself Sunday night because I was coming back in on Monday. I felt so bad that I could not rewrap them until Tuesday.    Pertinent History Was diagnosed Nov 25,2013 with ovarian cancer. Pt underwent a total hysterectomy in Dec 2013 at that time no lymph nodes were involved. Pt is recieving chemotherapy infusions but did not require radiation. Now one lymph node is involved in right groin and pt has edema of  bilateral LEs with right worse than left. They initially thought it was cellulitis and she was hospitilized but later pt learned it was lymphedema. Pt has never had a DVT.   How long can you sit comfortably? approx 45  min to an hour, pt tries to get up every 20 minutes or so at work   Patient Stated Goals to get LE swelling under control and get some relief   Currently in Pain? No/denies                         [facility] Adult PT Treatment/Exercise - 07/27/15 0001    Manual Therapy   Manual Therapy Manual Lymphatic Drainage (MLD);Compression Bandaging   Manual Lymphatic Drainage (MLD) Manual lymph drainage to BLE in supine: bilateral axillae, bilateral inguino-axillary pathways staying lateral due to abdominal ascites; Left LE lateral hip and then medial to lateral hip redirecting along pathway; left lower leg and ankle/dorsal foot redirecting along lateral hip and then pathways; repeated process with right LE; ended with bilateral axillae.  Avoided abdominal region due to ascites.   Compression Bandaging To right LE: lotion; stockinette from foot to groin; artiflex focused mostly on right foot, ankle and knee; 1-8 cm, 2-10 cm, and 5-12 cm short stretch compression bandages from foot to groin.                   Short Term Clinic Goals - 07/20/15 1727    CC Short Term Goal  #  1   Title Pt to demonstrate a 3 cm decrease of right thigh edema (20 cm above suprapatella) to decrease risk of cellulitis   Baseline 51.6 cm; 54.5 cm 07/20/15 but the bandage had slid beneath this point and pt had great reductions below this causing increase fluid above   CC Short Term Goal  #2   Title Pt to demonstrate a 2 cm decrease of right calf edema (30cm proximal to floor) to decrease risk of cellulitis   Baseline 38 cm; 36.2 cm 07/20/15   Status Partially [facility] Goals - 07/14/15 1203    CC Long Term Goal  #1   Title Pt to demonstrate a 6 cm decrease of right thigh edema (20 cm above suprapatella) to decrease risk of cellulitis   Baseline 51.6 cm   Time 8   Period Weeks   Status New   CC Long Term Goal  #2   Title Pt to demonstrate a 4 cm decrease of right calf edema  (30cm proximal to floor) to decrease risk of cellulitis   Baseline 38   Time 8   Period Weeks   Status New   CC Long Term Goal  #3   Title Pt to be receive with appropriate compression garments for day and night time use for long term management of edema   Baseline none   Time 8   Period Weeks   Status New   CC Long Term Goal  #4   Title Pt to verbalize signs/symptoms of infection in bilateral LEs and lymphedema precautions   Baseline none   Time 8   Period Weeks   Status New            Plan - 07/27/15 1616    Clinical Impression Statement Pt was measured for a compression garment today for her trip to [place]. She will be leaving next Thursday for a week. Pt has been sick and went without bandages from Sunday until Tuesday and then re wrapped herself on Tuesday. She feels her swelling is increased this visit because she did not get the bandages tight enough. She continues to have an increase in right thigh edema. Once her RLE is maximally reduced bandages will be applied to LLE.    Pt will benefit from skilled therapeutic intervention in order to improve on the following deficits Increased edema   Rehab Potential Good   Clinical Impairments Affecting Rehab Potential pt goes for chemotherapy infusions   PT Frequency 2x / week   PT Duration 8 weeks   PT Treatment/Interventions Vasopneumatic Device;Manual lymph drainage;Compression bandaging;Therapeutic exercise   PT Next Visit Plan continue MLD and bandaging to RLE until maximally reduced   Consulted and Agree with Plan of Care Patient        Problem List Patient Active Problem List   Diagnosis Date Noted  . POSTNASAL DRIP 08/23/2009  . ASTHMA 08/01/2009  . ARTHRITIS 08/01/2009  . SHORTNESS OF BREATH (SOB) 08/01/2009    Alexia Freestone 07/27/2015, 5:15 PM  [place], [place], [address] Phone: [phone]   Fax:  [phone]  Name:  Anna Kennedy MRN: [id number] Date of Birth: 01-Nov-1950    Allyson Sabal, PT 07/27/2015 5:15 PM

## 2015-07-28 ENCOUNTER — Ambulatory Visit: Payer: BLUE CROSS/BLUE SHIELD | Admitting: Physical Therapy

## 2015-07-28 DIAGNOSIS — I890 Lymphedema, not elsewhere classified: Secondary | ICD-10-CM | POA: Diagnosis not present

## 2015-07-28 NOTE — Therapy (Signed)
[place], [place], [address] Phone: [phone]   Fax:  [phone]  Physical Therapy Treatment  Patient Details  Name: Anna Kennedy MRN: [id number] Date of Birth: Dec 19, 1950 Referring Provider: Darcus Austin  Encounter Date: 07/28/2015      PT End of Session - 07/28/15 1638    Visit Number 6   Number of Visits 16   Date for PT Re-Evaluation 09/08/15   PT Start Time [id number]   PT Stop Time 1430   PT Time Calculation (min) 85 min   Activity Tolerance Patient tolerated treatment well   Behavior During Therapy [facility] for tasks assessed/performed      Past Medical History  Diagnosis Date  . Retinal tear     treated previously with laser surgery  . Atypical chest pain   . Other social stressor   . Osteoarthritis of both knees   . History of depression   . History of asthma   . Cancer ([place])     ovarian    Past Surgical History  Procedure Laterality Date  . Knee surgery      Of her knees  . Foot surgery      Right foot    There were no vitals filed for this visit.  Visit Diagnosis:  Lymphedema of both lower extremities      Subjective Assessment - 07/28/15 1637    Subjective Pt reports she got measure for 30-40 thigh high each leg and hopes to have them by her trip next week. She says she will fit in "off the shelf " She states her husband is coming in today to observe compression wrapping    Pertinent History Was diagnosed Nov 25,2013 with ovarian cancer. Pt underwent a total hysterectomy in Dec 2013 at that time no lymph nodes were involved. Pt is recieving chemotherapy infusions but did not require radiation. Now one lymph node is involved in right groin and pt has edema of  bilateral LEs with right worse than left. They initially thought it was cellulitis and she was hospitilized but later pt learned it was lymphedema. Pt has never had a DVT.   Patient Stated Goals to get LE swelling under  control and get some relief               LYMPHEDEMA/ONCOLOGY QUESTIONNAIRE - 07/28/15 1318    Right Lower Extremity Lymphedema   At Groin Measure at Horizontal from Pubic Bone 60 cm   30 cm Proximal to Suprapatella 61.5 cm   20 cm Proximal to Suprapatella 51 cm   10 cm Proximal to Suprapatella 45 cm   At Midpatella/Popliteal Crease 38 cm   30 cm Proximal to Floor at Lateral Plantar Foot 32 cm   20 cm Proximal to Floor at Lateral Plantar Foot 25 1   10  cm Proximal to Floor at Lateral Malleoli 22 cm   5 cm Proximal to 1st MTP Joint 21.5 cm   Across MTP Joint 21 cm   Around Proximal Great Toe 7.7 cm                  [facility] Adult PT Treatment/Exercise - 07/28/15 0001    Self-Care   Self-Care Other Self-Care Comments   Other Self-Care Comments  husband observed compression bandaging and vervbalized understanding with handout for cues. showed pt /husband donning butler    Manual Therapy   Manual Lymphatic Drainage (MLD) Manual lymph drainage to BLE in supine: bilateral axillae, bilateral  inguino-axillary pathways staying lateral due to abdominal ascites; Left LE lateral hip and then medial to lateral hip redirecting along pathway; left lower leg and ankle/dorsal foot redirecting along lateral hip and then pathways; repeated process with right LE; ended with bilateral axillae.  Avoided abdominal region due to ascites.   Compression Bandaging To right LE: lotion; stockinette from foot to groin; artiflex focused mostly on right foot, ankle and knee; 1-8 cm, 2-10 cm, and 5-12 cm short stretch compression bandages from foot to groin.                PT Education - 07/28/15 1637    Education provided Yes   Education Details compression bandaging    Person(s) Educated Patient;Spouse   Methods Explanation;Demonstration;Handout   Comprehension Verbalized understanding           Short Term Clinic Goals - 07/28/15 1645    CC Short Term Goal  #1   Title Pt to  demonstrate a 3 cm decrease of right thigh edema (20 cm above suprapatella) to decrease risk of cellulitis   Baseline 51.6 cm; 54.5 cm 07/20/15 but the bandage had slid beneath this point and pt had great reductions below this causing increase fluid above, 3//16: 51   Status On-going   CC Short Term Goal  #2   Title Pt to demonstrate a 2 cm decrease of right calf edema (30cm proximal to floor) to decrease risk of cellulitis   Baseline 38 cm; 36.2 cm 07/20/15, 32 cm on 3//16/2017   Status Achieved             Long Term Clinic Goals - 07/28/15 1647    CC Long Term Goal  #1   Title Pt to demonstrate a 6 cm decrease of right thigh edema (20 cm above suprapatella) to decrease risk of cellulitis   Status On-going   CC Long Term Goal  #2   Title Pt to demonstrate a 4 cm decrease of right calf edema (30cm proximal to floor) to decrease risk of cellulitis   Baseline 38, 32 cm on 07/28/2015   Status Achieved   CC Long Term Goal  #3   Title Pt to be receive with appropriate compression garments for day and night time use for long term management of edema   Status On-going   CC Long Term Goal  #4   Title Pt to verbalize signs/symptoms of infection in bilateral LEs and lymphedema precautions   Status On-going            Plan - 07/28/15 1638    Clinical Impression Statement Pt continues with good reduction in RLE per measurement today.  She has visible swelling above top of bandages and does not feel her bike shorts are helping to keep bandages up.  She continues to have abdominal ascites and is not able to tolerate more compressive bike shorts. ?? wonder if thera are maternity bike shorts avaialbe?  Showed pt golds gym bands to keep bandages on thighs.  She hopes to get her "off the shelf" compression hose by next week and wonders if she will still need to bandage if she has the stockings.    Pt will benefit from skilled therapeutic intervention in order to improve on the following deficits  Increased edema   Rehab Potential Good   Clinical Impairments Affecting Rehab Potential pt goes for chemotherapy infusions   PT Treatment/Interventions Vasopneumatic Device;Manual lymph drainage;Compression bandaging;Therapeutic exercise   PT Next Visit Plan continue MLD and bandaging  to RLE until maximally reduced   Consulted and Agree with Plan of Care Patient        Problem List Patient Active Problem List   Diagnosis Date Noted  . POSTNASAL DRIP 08/23/2009  . ASTHMA 08/01/2009  . ARTHRITIS 08/01/2009  . SHORTNESS OF BREATH (SOB) 08/01/2009   Donato Heinz. Owens Shark, PT    07/28/2015, 4:48 PM  [place], [place], [address] Phone: [phone]   Fax:  [phone]  Name: Anna Kennedy MRN: [id number] Date of Birth: Sep 03, 1950

## 2015-08-01 ENCOUNTER — Encounter: Payer: Self-pay | Admitting: Physical Therapy

## 2015-08-01 ENCOUNTER — Ambulatory Visit: Payer: BLUE CROSS/BLUE SHIELD | Admitting: Physical Therapy

## 2015-08-01 DIAGNOSIS — I890 Lymphedema, not elsewhere classified: Secondary | ICD-10-CM

## 2015-08-01 NOTE — Therapy (Signed)
[place], [place], [address] Phone: [phone]   Fax:  [phone]  Physical Therapy Treatment  Patient Details  Name: Anna Kennedy MRN: [id number] Date of Birth: [birth year] Referring Provider: Darcus Austin  Encounter Date: 08/01/2015      PT End of Session - 08/01/15 1508    Visit Number 7   Number of Visits 16   Date for PT Re-Evaluation 09/08/15   PT Start Time [id number]   PT Stop Time 1514   PT Time Calculation (min) 80 min   Activity Tolerance Patient tolerated treatment well   Behavior During Therapy [facility] for tasks assessed/performed      Past Medical History  Diagnosis Date  . Retinal tear     treated previously with laser surgery  . Atypical chest pain   . Other social stressor   . Osteoarthritis of both knees   . History of depression   . History of asthma   . Cancer ([place])     ovarian    Past Surgical History  Procedure Laterality Date  . Knee surgery      Of her knees  . Foot surgery      Right foot    There were no vitals filed for this visit.  Visit Diagnosis:  Lymphedema of both lower extremities - Plan: PT plan of care cert/re-cert      Subjective Assessment - 08/01/15 1356    Subjective Pt purchased an abdominal binder to wear around the top of her right thigh to help hold bandages up. Pt reports that this has helped significantly. Sunday pt took all of her bandages off to shower and she reapplied bandages today but did not use all of her bandages because she would be coming to her appointment in a few hours.    Pertinent History Was diagnosed Nov 25,2013 with ovarian cancer. Pt underwent a total hysterectomy in Dec 2013 at that time no lymph nodes were involved. Pt is recieving chemotherapy infusions but did not require radiation. Now one lymph node is involved in right groin and pt has edema of  bilateral LEs with right worse than left. They initially thought it was  cellulitis and she was hospitilized but later pt learned it was lymphedema. Pt has never had a DVT.   Patient Stated Goals to get LE swelling under control and get some relief   Currently in Pain? No/denies                         [facility] Adult PT Treatment/Exercise - 08/01/15 0001    Manual Therapy   Manual Therapy Manual Lymphatic Drainage (MLD)   Manual Lymphatic Drainage (MLD) Manual lymph drainage to BLE in supine: bilateral axillae, bilateral inguino-axillary pathways staying lateral due to abdominal ascites; Left LE lateral hip and then medial to lateral hip redirecting along pathway; left lower leg and ankle/dorsal foot redirecting along lateral hip and then pathways; repeated process with right LE; ended with bilateral axillae.  Avoided abdominal region due to ascites.   Compression Bandaging To right LE: lotion; stockinette from foot to groin; artiflex focused mostly on right foot, ankle and knee; 1-8 cm, 2-10 cm, and 5-12 cm short stretch compression bandages from foot to groin.                   Short Term Clinic Goals - 07/28/15 1645    CC Short Term Goal  #1  Title Pt to demonstrate a 3 cm decrease of right thigh edema (20 cm above suprapatella) to decrease risk of cellulitis   Baseline 51.6 cm; 54.5 cm 07/20/15 but the bandage had slid beneath this point and pt had great reductions below this causing increase fluid above, 3//16: 51   Status On-going   CC Short Term Goal  #2   Title Pt to demonstrate a 2 cm decrease of right calf edema (30cm proximal to floor) to decrease risk of cellulitis   Baseline 38 cm; 36.2 cm 07/20/15, 32 cm on 3//16/2017   Status Achieved             Long Term Clinic Goals - 07/28/15 1647    CC Long Term Goal  #1   Title Pt to demonstrate a 6 cm decrease of right thigh edema (20 cm above suprapatella) to decrease risk of cellulitis   Status On-going   CC Long Term Goal  #2   Title Pt to demonstrate a 4 cm decrease of  right calf edema (30cm proximal to floor) to decrease risk of cellulitis   Baseline 38, 32 cm on 07/28/2015   Status Achieved   CC Long Term Goal  #3   Title Pt to be receive with appropriate compression garments for day and night time use for long term management of edema   Status On-going   CC Long Term Goal  #4   Title Pt to verbalize signs/symptoms of infection in bilateral LEs and lymphedema precautions   Status On-going            Plan - 08/01/15 1510    Clinical Impression Statement Discussed with pt about wearing bandages during her flight on her RLE and then managing her edema with compression garments while she is in [place]. Pt was educated to apply compression bandages at night if she feels an increase in edema during the day while wearing compression stockings. Pt continues to feel that some of the fluid is going in to her abdomen and she has called her doctor to discuss the possibility of having the fluid drained to increase comfort. The golds gym abdominal binder is helping hold her bandages in place.    Pt will benefit from skilled therapeutic intervention in order to improve on the following deficits Increased edema   Rehab Potential Good   Clinical Impairments Affecting Rehab Potential pt goes for chemotherapy infusions   PT Frequency 3x / week   PT Duration 8 weeks   PT Treatment/Interventions Vasopneumatic Device;Manual lymph drainage;Compression bandaging;Therapeutic exercise   PT Next Visit Plan continue MLD and bandaging to RLE until maximally reduced   Consulted and Agree with Plan of Care Patient        Problem List Patient Active Problem List   Diagnosis Date Noted  . POSTNASAL DRIP 08/23/2009  . ASTHMA 08/01/2009  . ARTHRITIS 08/01/2009  . SHORTNESS OF BREATH (SOB) 08/01/2009    Alexia Freestone 08/01/2015, 4:59 PM  [place], [place], [address] Phone: [phone]    Fax:  [phone]  Name: Anna Kennedy MRN: [id number] Date of Birth: 1951-04-25    Allyson Sabal, PT 08/01/2015 4:59 PM

## 2015-08-03 ENCOUNTER — Ambulatory Visit: Payer: BLUE CROSS/BLUE SHIELD

## 2015-08-03 DIAGNOSIS — I890 Lymphedema, not elsewhere classified: Secondary | ICD-10-CM

## 2015-08-03 NOTE — Therapy (Signed)
[place], [place], [address] Phone: [phone]   Fax:  [phone]  Physical Therapy Treatment  Patient Details  Name: Anna Kennedy MRN: [id number] Date of Birth: 14-Dec-1950 Referring Provider: Darcus Austin  Encounter Date: 08/03/2015      PT End of Session - 08/03/15 1530    Visit Number 8   Number of Visits 16   Date for PT Re-Evaluation 09/08/15   PT Start Time 1594   PT Stop Time 1514   PT Time Calculation (min) 86 min   Activity Tolerance Patient tolerated treatment well   Behavior During Therapy [facility] for tasks assessed/performed      Past Medical History  Diagnosis Date  . Retinal tear     treated previously with laser surgery  . Atypical chest pain   . Other social stressor   . Osteoarthritis of both knees   . History of depression   . History of asthma   . Cancer ([place])     ovarian    Past Surgical History  Procedure Laterality Date  . Knee surgery      Of her knees  . Foot surgery      Right foot    There were no vitals filed for this visit.  Visit Diagnosis:  Lymphedema of both lower extremities      Subjective Assessment - 08/03/15 1352    Subjective Hoping to hear back from [facility] soon to have the ascites fluid in my abdomen drained soon. The bandages keep sliding down on my thigh no matter what I try. Wore the [employer] binder and it helped some but then irritated my later exposed skin.   Pertinent History Was diagnosed Nov 25,2013 with ovarian cancer. Pt underwent a total hysterectomy in Dec 2013 at that time no lymph nodes were involved. Pt is recieving chemotherapy infusions but did not require radiation. Now one lymph node is involved in right groin and pt has edema of  bilateral LEs with right worse than left. They initially thought it was cellulitis and she was hospitilized but later pt learned it was lymphedema. Pt has never had a DVT.   Patient Stated Goals to  get LE swelling under control and get some relief   Currently in Pain? No/denies               LYMPHEDEMA/ONCOLOGY QUESTIONNAIRE - 08/03/15 1404    Right Lower Extremity Lymphedema   At Groin Measure at Horizontal from Pubic Bone 60.6 cm   30 cm Proximal to Suprapatella 60.4 cm   20 cm Proximal to Suprapatella 50.4 cm   10 cm Proximal to Suprapatella 45.2 cm   At Midpatella/Popliteal Crease 38.6 cm   30 cm Proximal to Floor at Lateral Plantar Foot 32.4 cm   20 cm Proximal to Floor at Lateral Plantar Foot 24._0 cm Proximal to Floor at Lateral Malleoli 21.9 cm   5 cm Proximal to 1st MTP Joint 21.5 cm   Across MTP Joint 21.8 cm  Distal to 5th metacarpal   Around Proximal Great Toe 7.6 cm                  [facility] Adult PT Treatment/Exercise - 08/03/15 0001    Manual Therapy   Manual Therapy Manual Lymphatic Drainage (MLD)   Manual Lymphatic Drainage (MLD) Manual lymph drainage to BLE in supine: bilateral axillae, bilateral inguino-axillary pathways staying lateral due to abdominal ascites; Left LE lateral  hip and then medial to lateral hip redirecting along pathway; left lower leg and ankle/dorsal foot redirecting along lateral hip and then pathways; repeated process with right LE; ended with bilateral axillae.  Avoided abdominal region due to ascites.   Compression Bandaging To right LE: lotion; thick stockinette from foot to knee and then knee to thigh; Artiflex to knee; 1-8 cm, 2-10 cm, and 5-12 cm short stretch compression bandages from foot to groin.                   Short Term Clinic Goals - 08/03/15 1537    CC Short Term Goal  #1   Title Pt to demonstrate a 3 cm decrease of right thigh edema (20 cm above suprapatella) to decrease risk of cellulitis  1.2 cm decrease 08/02/15   Baseline 51.6 cm; 54.5 cm 07/20/15 but the bandage had slid beneath this point and pt had great reductions below this causing increase fluid above, 3//16: 51; 08/03/15 50.4 cm;     CC Short Term Goal  #2   Title Pt to demonstrate a 2 cm decrease of right calf edema (30cm proximal to floor) to decrease risk of cellulitis   Status Achieved             Long Term Clinic Goals - 08/03/15 1538    CC Long Term Goal  #1   Title Pt to demonstrate a 6 cm decrease of right thigh edema (20 cm above suprapatella) to decrease risk of cellulitis   Status On-going   CC Long Term Goal  #2   Title Pt to demonstrate a 4 cm decrease of right calf edema (30cm proximal to floor) to decrease risk of cellulitis   Status Achieved   CC Long Term Goal  #3   Title Pt to be receive with appropriate compression garments for day and night time use for long term management of edema  Pt has received hre new day time 30-40 mmHg compression garments that she'll start wearing on her trip next week.    Status Partially Met   CC Long Term Goal  #4   Title Pt to verbalize signs/symptoms of infection in bilateral LEs and lymphedema precautions   Status On-going            Plan - 08/03/15 1530    Clinical Impression Statement Pt came in with [employer] binder on thigh and reported it did help keep bandage up some, but not completely and the skin that became exposed when bandaged slid down became irritated from binder. Tried wrapping pt with less Artiflex today (none at thigh) to see if this will help decrease slippage of thigh portion of bandage. Pts circumference measurements had reduced in some areas, but some slight incresaes at others like upper most thig and at knee where bandage had slid. Pt returns for one more visit tomorrow morning before her trip. She has her new 30-40 mmHg compression garments.    Pt will benefit from skilled therapeutic intervention in order to improve on the following deficits Increased edema   Rehab Potential Good   Clinical Impairments Affecting Rehab Potential pt goes for chemotherapy infusions   PT Frequency 3x / week   PT Duration 8 weeks   PT  Treatment/Interventions Vasopneumatic Device;Manual lymph drainage;Compression bandaging;Therapeutic exercise   PT Next Visit Plan continue MLD and bandaging to RLE until maximally reduced   Consulted and Agree with Plan of Care Patient        Problem List  Patient Active Problem List   Diagnosis Date Noted  . POSTNASAL DRIP 08/23/2009  . ASTHMA 08/01/2009  . ARTHRITIS 08/01/2009  . SHORTNESS OF BREATH (SOB) 08/01/2009    Otelia Limes, PTA 08/03/2015, 3:40 PM  [place], [place], [address] Phone: [phone]   Fax:  [phone]  Name: Anna Kennedy MRN: [id number] Date of Birth: 01-Jul-1950

## 2015-08-04 ENCOUNTER — Ambulatory Visit: Payer: BLUE CROSS/BLUE SHIELD | Admitting: Physical Therapy

## 2015-08-04 ENCOUNTER — Encounter: Payer: Self-pay | Admitting: Physical Therapy

## 2015-08-04 DIAGNOSIS — I890 Lymphedema, not elsewhere classified: Secondary | ICD-10-CM | POA: Diagnosis not present

## 2015-08-04 NOTE — Therapy (Signed)
[place], [place], [address] Phone: [phone]   Fax:  [phone]  Physical Therapy Treatment  Patient Details  Name: Anna Kennedy MRN: [id number] Date of Birth: July 15, 1950 Referring Provider: Darcus Austin  Encounter Date: 08/04/2015      PT End of Session - 08/04/15 0908    Visit Number 9   Number of Visits 16   Date for PT Re-Evaluation 09/08/15   PT Start Time 0805   PT Stop Time 0936   PT Time Calculation (min) 91 min   Activity Tolerance Patient tolerated treatment well   Behavior During Therapy [facility] for tasks assessed/performed      Past Medical History  Diagnosis Date  . Retinal tear     treated previously with laser surgery  . Atypical chest pain   . Other social stressor   . Osteoarthritis of both knees   . History of depression   . History of asthma   . Cancer ([place])     ovarian    Past Surgical History  Procedure Laterality Date  . Knee surgery      Of her knees  . Foot surgery      Right foot    There were no vitals filed for this visit.  Visit Diagnosis:  Lymphedema of both lower extremities      Subjective Assessment - 08/04/15 0818    Subjective Leaving after this appointment for my trip.  I think my leg is doing really well.   Pertinent History Was diagnosed Nov 25,2013 with ovarian cancer. Pt underwent a total hysterectomy in Dec 2013 at that time no lymph nodes were involved. Pt is recieving chemotherapy infusions but did not require radiation. Now one lymph node is involved in right groin and pt has edema of  bilateral LEs with right worse than left. They initially thought it was cellulitis and she was hospitilized but later pt learned it was lymphedema. Pt has never had a DVT.   Patient Stated Goals to get LE swelling under control and get some relief   Currently in Pain? No/denies               LYMPHEDEMA/ONCOLOGY QUESTIONNAIRE - 08/04/15 0819    Right Lower Extremity Lymphedema   At Groin Measure at Horizontal from Pubic Bone 56.9 cm   30 cm Proximal to Suprapatella 56 cm   20 cm Proximal to Suprapatella 48.5 cm   10 cm Proximal to Suprapatella 43 cm   At Midpatella/Popliteal Crease 38.3 cm   30 cm Proximal to Floor at Lateral Plantar Foot 31.3 cm   20 cm Proximal to Floor at Lateral Plantar Foot 24.1 1   10  cm Proximal to Floor at Lateral Malleoli 22 cm   5 cm Proximal to 1st MTP Joint 20.9 cm   Across MTP Joint 21.6 cm   Around Proximal Great Toe 7.6 cm                  [facility] Adult PT Treatment/Exercise - 08/04/15 0001    Manual Therapy   Manual Therapy Manual Lymphatic Drainage (MLD);Compression Bandaging   Manual therapy comments Unwrapped right LE and inspected skin which appears intact without irritation; measured rt LE which appears to have further reduced   Manual Lymphatic Drainage (MLD) Manual lymph drainage to BLE in supine: bilateral axillae, bilateral inguino-axillary pathways staying lateral due to abdominal ascites; Left LE lateral hip and then medial to lateral hip redirecting along pathway;  left lower leg and ankle/dorsal foot redirecting along lateral hip and then pathways; repeated process with right LE; ended with bilateral axillae.  Avoided abdominal region due to ascites.   Compression Bandaging To right LE: lotion; thick stockinette from foot to knee and then knee to thigh; Artiflex to knee; 1-8 cm, 2-10 cm, and 5-12 cm short stretch compression bandages from foot to groin.                PT Education - 08/04/15 1002    Education provided Yes   Education Details Educated pt on donning compression stocking on left leg; used garrdening gloves and slip aid for increasing ease; instructed her to use caution pulling garment due to abdominal hernias.   Person(s) Educated Patient   Methods Explanation;Demonstration;Tactile cues;Verbal cues   Comprehension Verbalized understanding;Returned  demonstration;Need further instruction;Other (comment)  Able to return demo with assistance; will need assistance from her husband to don stocking due to difficulty and abdominal hernias.           Short Term Clinic Goals - 08/03/15 1537    CC Short Term Goal  #1   Title Pt to demonstrate a 3 cm decrease of right thigh edema (20 cm above suprapatella) to decrease risk of cellulitis  1.2 cm decrease 08/02/15   Baseline 51.6 cm; 54.5 cm 07/20/15 but the bandage had slid beneath this point and pt had great reductions below this causing increase fluid above, 3//16: 51; 08/03/15 50.4 cm;    CC Short Term Goal  #2   Title Pt to demonstrate a 2 cm decrease of right calf edema (30cm proximal to floor) to decrease risk of cellulitis   Status Achieved             Long Term Clinic Goals - 08/03/15 1538    CC Long Term Goal  #1   Title Pt to demonstrate a 6 cm decrease of right thigh edema (20 cm above suprapatella) to decrease risk of cellulitis   Status On-going   CC Long Term Goal  #2   Title Pt to demonstrate a 4 cm decrease of right calf edema (30cm proximal to floor) to decrease risk of cellulitis   Status Achieved   CC Long Term Goal  #3   Title Pt to be receive with appropriate compression garments for day and night time use for long term management of edema  Pt has received hre new day time 30-40 mmHg compression garments that she'll start wearing on her trip next week.    Status Partially Met   CC Long Term Goal  #4   Title Pt to verbalize signs/symptoms of infection in bilateral LEs and lymphedema precautions   Status On-going            Plan - 08/04/15 0959    Clinical Impression Statement Right leg continues to reduce and she is very pleased with her progress.  She was instructed with donning her left leg compression stocking today and it appears to be a perfect fit.  Her plan is to stayed bandaged on the right leg until the end of the day tomorrow after her flight and then  wear her compression stockings during the day on her trip.  She had some difficuly donning her garment but her husband should be able to help. After she returns from her trip, as long as her right leg maintained the reduction in swelling, she should be ready to begin bandaging for her left leg.   Pt  will benefit from skilled therapeutic intervention in order to improve on the following deficits Increased edema   Rehab Potential Good   Clinical Impairments Affecting Rehab Potential pt goes for chemotherapy infusions   PT Frequency 3x / week   PT Duration 8 weeks   PT Treatment/Interventions Vasopneumatic Device;Manual lymph drainage;Compression bandaging;Therapeutic exercise   PT Next Visit Plan Reassess right leg when she returns from her trip.  If it maintained, begin bandaging on left leg and instruct to ewar compression stocking on right leg.  If it did not maintain, resume treatment on right leg until plateau.   PT Home Exercise Plan Self manual lymph drainage for Lt LE.   Consulted and Agree with Plan of Care Patient        Problem List Patient Active Problem List   Diagnosis Date Noted  . POSTNASAL DRIP 08/23/2009  . ASTHMA 08/01/2009  . ARTHRITIS 08/01/2009  . SHORTNESS OF BREATH (SOB) 08/01/2009   Annia Friendly, PT 08/04/2015 10:05 AM  [place], [place], [address] Phone: [phone]   Fax:  [phone]  Name: Anna Kennedy MRN: [id number] Date of Birth: 03-05-1951

## 2015-08-11 ENCOUNTER — Ambulatory Visit: Payer: BLUE CROSS/BLUE SHIELD | Admitting: Physical Therapy

## 2015-08-11 DIAGNOSIS — I890 Lymphedema, not elsewhere classified: Secondary | ICD-10-CM

## 2015-08-11 NOTE — Therapy (Signed)
[place], [place], [address] Phone: [phone]   Fax:  [phone]  Physical Therapy Treatment  Patient Details  Name: Anna Kennedy MRN: [id number] Date of Birth: January 14, 1951 Referring Provider: Darcus Austin  Encounter Date: 08/11/2015      PT End of Session - 08/11/15 1749    Visit Number 10   Number of Visits 16   Date for PT Re-Evaluation 09/08/15   PT Start Time 7591   PT Stop Time 1735   PT Time Calculation (min) 90 min   Activity Tolerance Patient tolerated treatment well   Behavior During Therapy [facility] for tasks assessed/performed      Past Medical History  Diagnosis Date  . Retinal tear     treated previously with laser surgery  . Atypical chest pain   . Other social stressor   . Osteoarthritis of both knees   . History of depression   . History of asthma   . Cancer ([place])     ovarian    Past Surgical History  Procedure Laterality Date  . Knee surgery      Of her knees  . Foot surgery      Right foot    There were no vitals filed for this visit.  Visit Diagnosis:  Lymphedema of both lower extremities      Subjective Assessment - 08/11/15 1750    Subjective Pt states she was able to wear both compression thigh highs every day and wrapped her leg each night while on vacation.  She comes in with bandage on right leg today after having driven home from [place] earlier today. She is going to [place] a [facility] tomorrow and may have abdominal swelling drained.  She will talk with MD about lymphedema management and if she is able to have any compression a all on abdomen    Pertinent History Was diagnosed Nov 25,2013 with ovarian cancer. Pt underwent a total hysterectomy in Dec 2013 at that time no lymph nodes were involved. Pt is recieving chemotherapy infusions but did not require radiation. Now one lymph node is involved in right groin and pt has edema of  bilateral LEs with right worse  than left. They initially thought it was cellulitis and she was hospitilized but later pt learned it was lymphedema. Pt has never had a DVT.   Patient Stated Goals to get LE swelling under control and get some relief   Currently in Pain? No/denies               LYMPHEDEMA/ONCOLOGY QUESTIONNAIRE - 08/11/15 1625    Right Lower Extremity Lymphedema   30 cm Proximal to Suprapatella 62 cm   20 cm Proximal to Suprapatella 50.5 cm   10 cm Proximal to Suprapatella 45.5 cm   At Midpatella/Popliteal Crease 40.5 cm   30 cm Proximal to Floor at Lateral Plantar Foot 32.8 cm   20 cm Proximal to Floor at Lateral Plantar Foot 26 1   10  cm Proximal to Floor at Lateral Malleoli 21.9 cm   5 cm Proximal to 1st MTP Joint 21 cm   Around Proximal Great Toe 7.5 cm                  [facility] Adult PT Treatment/Exercise - 08/11/15 0001    Self-Care   Self-Care Other Self-Care Comments   Other Self-Care Comments  discussed options for nightime garments.  Gave pt website for circaid garments for pt to  research  also gave her [employer] walk[employer] catalog for alps donnin lotion suggestion as she is having trouble donning stocking  gave pt chip pack to place at lateral hip when sitting in chair to help with fibrosis management   Manual Therapy   Manual therapy comments Unwrapped right LE and inspected skin which appears intact without irritation; measured both LE   Manual Lymphatic Drainage (MLD) Manual lymph drainage to BLE in supine: bilateral axillae, bilateral inguino-axillary pathways staying lateral due to abdominal ascites; Left LE lateral hip and then medial to lateral hip redirecting along pathway; left lower leg and ankle/dorsal foot redirecting along lateral hip and then pathways; repeated process with right LE; ended with bilateral axillae.  Avoided abdominal region due to ascites.   Compression Bandaging To right LE: lotion; thick stockinette from foot to knee and then knee to thigh; Artiflex to knee;  1-8 cm, 2-10 cm, and 5-12 cm short stretch compression bandages from foot to groin.                PT Education - 08/11/15 1756    Education provided Yes   Education Details see self care section    Person(s) Educated Patient   Methods Explanation   Comprehension Verbalized understanding;Need further instruction           Short Term Clinic Goals - 08/03/15 1537    CC Short Term Goal  #1   Title Pt to demonstrate a 3 cm decrease of right thigh edema (20 cm above suprapatella) to decrease risk of cellulitis  1.2 cm decrease 08/02/15   Baseline 51.6 cm; 54.5 cm 07/20/15 but the bandage had slid beneath this point and pt had great reductions below this causing increase fluid above, 3//16: 51; 08/03/15 50.4 cm;    CC Short Term Goal  #2   Title Pt to demonstrate a 2 cm decrease of right calf edema (30cm proximal to floor) to decrease risk of cellulitis   Status Achieved             Long Term Clinic Goals - 08/03/15 1538    CC Long Term Goal  #1   Title Pt to demonstrate a 6 cm decrease of right thigh edema (20 cm above suprapatella) to decrease risk of cellulitis   Status On-going   CC Long Term Goal  #2   Title Pt to demonstrate a 4 cm decrease of right calf edema (30cm proximal to floor) to decrease risk of cellulitis   Status Achieved   CC Long Term Goal  #3   Title Pt to be receive with appropriate compression garments for day and night time use for long term management of edema  Pt has received hre new day time 30-40 mmHg compression garments that she'll start wearing on her trip next week.    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 OR, she could proceed with badaging alternatives at this point and forego  further wrapping. here since she knows how to wrap at home.  Pt to talk with MD about "big picture" and let us know how she wants to proceed. pt needs more work with donning devices .    PT Next Visit Plan Reassess right leg continue begin bandaging on left leg til i plateaus  and instruct to ewar compression stocking on right leg. Instruct in donning assist options and assist with nighttime garment options    Consulted and Agree with Plan of Care Patient        Problem List Patient Active Problem List   Diagnosis Date Noted  . POSTNASAL DRIP 08/23/2009  . ASTHMA 08/01/2009  . ARTHRITIS 08/01/2009  . SHORTNESS OF BREATH (SOB) 08/01/2009   Donato Heinz. Owens Shark, PT   08/11/2015, 6:04 PM  [place], [place], [address] Phone: [phone]   Fax:  [phone]  Name: Anna Kennedy MRN: [id number] Date of Birth: 23-Apr-1951

## 2015-08-15 ENCOUNTER — Ambulatory Visit: Payer: BLUE CROSS/BLUE SHIELD | Attending: Family Medicine | Admitting: Physical Therapy

## 2015-08-15 ENCOUNTER — Encounter: Payer: Self-pay | Admitting: Physical Therapy

## 2015-08-15 DIAGNOSIS — I890 Lymphedema, not elsewhere classified: Secondary | ICD-10-CM

## 2015-08-15 NOTE — Therapy (Signed)
[place], [place], [address] Phone: [phone]   Fax:  [phone]  Physical Therapy Treatment  Patient Details  Name: Anna Kennedy MRN: [id number] Date of Birth: [birth year] Referring Provider: Darcus Austin  Encounter Date: 08/15/2015      PT End of Session - 08/15/15 1018    Visit Number 11   Number of Visits 16   Date for PT Re-Evaluation 09/08/15   PT Start Time 0851   PT Stop Time 1019   PT Time Calculation (min) 88 min   Activity Tolerance Patient tolerated treatment well   Behavior During Therapy [facility] for tasks assessed/performed      Past Medical History  Diagnosis Date  . Retinal tear     treated previously with laser surgery  . Atypical chest pain   . Other social stressor   . Osteoarthritis of both knees   . History of depression   . History of asthma   . Cancer ([place])     ovarian    Past Surgical History  Procedure Laterality Date  . Knee surgery      Of her knees  . Foot surgery      Right foot    There were no vitals filed for this visit.  Visit Diagnosis:  Lymphedema of both lower extremities      Subjective Assessment - 08/15/15 0855    Subjective I removed the bandages on Sunday and pt has been managing her edema with compression stockings. Pt feels her RLE is a little swollen today but states her swelling was better controlled after she took the bandages off. Pt states she went to [facility] on Friday and they cleared her for abdominal drainage because they felt it was lymphatic fluid.    Pertinent History Was diagnosed Nov 25,2013 with ovarian cancer. Pt underwent a total hysterectomy in Dec 2013 at that time no lymph nodes were involved. Pt is recieving chemotherapy infusions but did not require radiation. Now one lymph node is involved in right groin and pt has edema of  bilateral LEs with right worse than left. They initially thought it was cellulitis and she was  hospitilized but later pt learned it was lymphedema. Pt has never had a DVT.   How long can you sit comfortably? approx 45 min to an hour, pt tries to get up every 20 minutes or so at work   Patient Stated Goals to get LE swelling under control and get some relief   Currently in Pain? No/denies               LYMPHEDEMA/ONCOLOGY QUESTIONNAIRE - 08/15/15 0903    Right Lower Extremity Lymphedema   30 cm Proximal to Suprapatella 55.3 cm   20 cm Proximal to Suprapatella 49.5 cm   10 cm Proximal to Suprapatella 44.7 cm   At Midpatella/Popliteal Crease 38.5 cm   30 cm Proximal to Floor at Lateral Plantar Foot 34 cm   20 cm Proximal to Floor at Lateral Plantar Foot 25._0 cm Proximal to Floor at Lateral Malleoli 22 cm   5 cm Proximal to 1st MTP Joint 19.9 cm   Across MTP Joint 22.6 cm   Around Proximal Great Toe 7.2 cm                  [facility] Adult PT Treatment/Exercise - 08/15/15 0001    Manual Therapy   Manual Therapy Manual Lymphatic Drainage (MLD);Compression Bandaging  Manual Lymphatic Drainage (MLD) Manual lymph drainage to BLE in supine: short neck, superficial and deep abdominals, bilateral axillae, bilateral inguino-axillary pathways; Left LE lateral hip and then medial to lateral hip redirecting along pathway; left lower leg and ankle/dorsal foot redirecting along lateral hip and then pathways; repeated process with right LE; ended with bilateral axillae.    Compression Bandaging To left LE: lotion; thick stockinette from foot to knee and then knee to thigh; Artiflex to knee; 1-8 cm, 2-10 cm, and 5-12 cm short stretch compression bandages from foot to groin.                PT Education - 08/15/15 1438    Education provided Yes   Education Details how to use donning aid   Person(s) Educated Patient   Methods Explanation;Demonstration   Comprehension Verbalized understanding;Returned demonstration           Short Term Clinic Goals - 08/03/15 1537     CC Short Term Goal  #1   Title Pt to demonstrate a 3 cm decrease of right thigh edema (20 cm above suprapatella) to decrease risk of cellulitis  1.2 cm decrease 08/02/15   Baseline 51.6 cm; 54.5 cm 07/20/15 but the bandage had slid beneath this point and pt had great reductions below this causing increase fluid above, 3//16: 51; 08/03/15 50.4 cm;    CC Short Term Goal  #2   Title Pt to demonstrate a 2 cm decrease of right calf edema (30cm proximal to floor) to decrease risk of cellulitis   Status Achieved             Long Term Clinic Goals - 08/03/15 1538    CC Long Term Goal  #1   Title Pt to demonstrate a 6 cm decrease of right thigh edema (20 cm above suprapatella) to decrease risk of cellulitis   Status On-going   CC Long Term Goal  #2   Title Pt to demonstrate a 4 cm decrease of right calf edema (30cm proximal to floor) to decrease risk of cellulitis   Status Achieved   CC Long Term Goal  #3   Title Pt to be receive with appropriate compression garments for day and night time use for long term management of edema  Pt has received hre new day time 30-40 mmHg compression garments that she'll start wearing on her trip next week.    Status Partially Met   CC Long Term Goal  #4   Title Pt to verbalize signs/symptoms of infection in bilateral LEs and lymphedema precautions   Status On-going            Plan - 08/15/15 1436    Clinical Impression Statement Compression bandages applied to pt's LLE this visit. Her right lower extremity is being controlled with her compression garment. She was instructed in correct way to use a donning assist today and was able to get her compression garment on RLE independently. She had a 6 cm reduction at right thigh. Will send insurance info to DME company to assess for coverage of night time garment.    Pt will benefit from skilled therapeutic intervention in order to improve on the following deficits Increased edema   Rehab Potential Good    Clinical Impairments Affecting Rehab Potential pt goes for chemotherapy infusions   PT Frequency 3x / week   PT Duration 8 weeks   PT Treatment/Interventions Vasopneumatic Device;Manual lymph drainage;Compression bandaging;Therapeutic exercise   PT Next Visit Plan MLD to BLEs  and compression bandaging to LLE, assess RLE measurements with use of stocking   Consulted and Agree with Plan of Care Patient        Problem List Patient Active Problem List   Diagnosis Date Noted  . POSTNASAL DRIP 08/23/2009  . ASTHMA 08/01/2009  . ARTHRITIS 08/01/2009  . SHORTNESS OF BREATH (SOB) 08/01/2009    Alexia Freestone 08/15/2015, 2:40 PM  [place], [place], [address] Phone: [phone]   Fax:  [phone]  Name: Anna Kennedy MRN: [id number] Date of Birth: [birth year]    Allyson Sabal, PT 08/15/2015 2:41 PM

## 2015-08-17 ENCOUNTER — Ambulatory Visit: Payer: BLUE CROSS/BLUE SHIELD

## 2015-08-17 DIAGNOSIS — I890 Lymphedema, not elsewhere classified: Secondary | ICD-10-CM | POA: Diagnosis not present

## 2015-08-17 NOTE — Therapy (Signed)
[place], [place], [address] Phone: [phone]   Fax:  [phone]  Physical Therapy Treatment  Patient Details  Name: Anna Kennedy MRN: [id number] Date of Birth: [birth year] Referring Provider: Darcus Austin  Encounter Date: 08/17/2015      PT End of Session - 08/17/15 1656    Visit Number 12   Number of Visits 16   Date for PT Re-Evaluation 09/08/15   PT Start Time 1519   PT Stop Time 1645   PT Time Calculation (min) 86 min   Activity Tolerance Patient tolerated treatment well   Behavior During Therapy [facility] for tasks assessed/performed      Past Medical History  Diagnosis Date  . Retinal tear     treated previously with laser surgery  . Atypical chest pain   . Other social stressor   . Osteoarthritis of both knees   . History of depression   . History of asthma   . Cancer ([place])     ovarian    Past Surgical History  Procedure Laterality Date  . Knee surgery      Of her knees  . Foot surgery      Right foot    There were no vitals filed for this visit.  Visit Diagnosis:  Lymphedema of both lower extremities      Subjective Assessment - 08/17/15 1534    Subjective I think the fluid is draining well form my leg because the bandages slipped down alot today. I try rewarpping the top 3 or 4 but it just keeps sliding.    Pertinent History Was diagnosed Nov 25,2013 with ovarian cancer. Pt underwent a total hysterectomy in Dec 2013 at that time no lymph nodes were involved. Pt is recieving chemotherapy infusions but did not require radiation. Now one lymph node is involved in right groin and pt has edema of  bilateral LEs with right worse than left. They initially thought it was cellulitis and she was hospitilized but later pt learned it was lymphedema. Pt has never had a DVT.   Patient Stated Goals to get LE swelling under control and get some relief   Currently in Pain? No/denies                LYMPHEDEMA/ONCOLOGY QUESTIONNAIRE - 08/17/15 1535    Left Lower Extremity Lymphedema   30 cm Proximal to Suprapatella 58.9 cm   20 cm Proximal to Suprapatella 53.7 cm   10 cm Proximal to Suprapatella 44.3 cm   At Midpatella/Popliteal Crease 37.6 cm   30 cm Proximal to Floor at Lateral Plantar Foot 32.2 cm   20 cm Proximal to Floor at Lateral Plantar Foot 25.2 cm   10 cm Proximal to Floor at Lateral Malleoli 21.9 cm   5 cm Proximal to 1st MTP Joint 20.8 cm   Across MTP Joint 22.6 cm   Around Proximal Great Toe 7.5 cm                  [facility] Adult PT Treatment/Exercise - 08/17/15 0001    Manual Therapy   Manual Lymphatic Drainage (MLD) Manual lymph drainage to BLE in supine: short neck, superficial and deep abdominals, bilateral axillae, bilateral inguino-axillary pathways; Left LE anterior lower abdomen, lateral hip and then medial to lateral hip redirecting along pathway; left lower leg and ankle/dorsal foot redirecting along lateral hip and then pathways; repeated process with right LE; ended with bilateral axillae.    Compression Bandaging  To left LE: lotion; thick stockinette from foot to knee and then knee to thigh; Artiflex to knee; 1-8 cm, 2-10 cm, and 4-12 cm short stretch compression bandages from foot to groin.                PT Education - 08/17/15 1655    Education provided Yes   Education Details Superficial and deep abdominal series with manual lyph drainage   Person(s) Educated Patient   Methods Explanation;Demonstration;Handout   Comprehension Verbalized understanding;Returned demonstration;Need further instruction           Short Term Clinic Goals - 08/03/15 1537    CC Short Term Goal  #1   Title Pt to demonstrate a 3 cm decrease of right thigh edema (20 cm above suprapatella) to decrease risk of cellulitis  1.2 cm decrease 08/02/15   Baseline 51.6 cm; 54.5 cm 07/20/15 but the bandage had slid beneath this point and pt had great  reductions below this causing increase fluid above, 3//16: 51; 08/03/15 50.4 cm;    CC Short Term Goal  #2   Title Pt to demonstrate a 2 cm decrease of right calf edema (30cm proximal to floor) to decrease risk of cellulitis   Status Achieved             Long Term Clinic Goals - 08/17/15 1703    CC Long Term Goal  #1   Title Pt to demonstrate a 6 cm decrease of right thigh edema (20 cm above suprapatella) to decrease risk of cellulitis   Status Achieved   CC Long Term Goal  #2   Title Pt to demonstrate a 4 cm decrease of right calf edema (30cm proximal to floor) to decrease risk of cellulitis   Status Achieved   CC Long Term Goal  #3   Title Pt to be receive with appropriate compression garments for day and night time use for long term management of edema  Has day time garments, in process of getting nighttime garments.   Status Partially Met   CC Long Term Goal  #4   Title Pt to verbalize signs/symptoms of infection in bilateral LEs and lymphedema precautions   Status Partially Met            Plan - 08/17/15 1657    Clinical Impression Statement Pts bandaging had slipped down to mid thigh from last treatment and pt reported she had rewrapped top 3-4 bandages this morning. Reviewed with pt that this was a good sign of reduction especially being the first time we had bandaged that LE and she verbalized understanding and reported noticing it looked better today. She is eager to get this LE reduced so she can be D/C'd to her compression stockings. Looked into a donning butler at [facility] and they only had a short one but she thinks it might work so she is debating on getting that soon as she has been having difficulty getting them on herself. Measured circumference of her Lt LE today for baseline.    Pt will benefit from skilled therapeutic intervention in order to improve on the following deficits Increased edema   Rehab Potential Good   Clinical Impairments Affecting Rehab  Potential pt goes for chemotherapy infusions   PT Frequency 3x / week   PT Duration 8 weeks   PT Treatment/Interventions Vasopneumatic Device;Manual lymph drainage;Compression bandaging;Therapeutic exercise   PT Next Visit Plan MLD to BLEs and compression bandaging to LLE, assess RLE measurements with use of stocking  PT Home Exercise Plan Self manual lymph drainage for Rt LE.   Consulted and Agree with Plan of Care Patient        Problem List Patient Active Problem List   Diagnosis Date Noted  . POSTNASAL DRIP 08/23/2009  . ASTHMA 08/01/2009  . ARTHRITIS 08/01/2009  . SHORTNESS OF BREATH (SOB) 08/01/2009    Otelia Limes, PTA 08/17/2015, 5:05 PM  [place], [place], [address] Phone: [phone]   Fax:  [phone]  Name: Anna Kennedy MRN: [id number] Date of Birth: [birth year]

## 2015-08-18 ENCOUNTER — Encounter: Payer: Self-pay | Admitting: Physical Therapy

## 2015-08-18 ENCOUNTER — Ambulatory Visit: Payer: BLUE CROSS/BLUE SHIELD | Admitting: Physical Therapy

## 2015-08-18 DIAGNOSIS — I890 Lymphedema, not elsewhere classified: Secondary | ICD-10-CM

## 2015-08-18 NOTE — Therapy (Signed)
[place], [place], [address] Phone: [phone]   Fax:  [phone]  Physical Therapy Treatment  Patient Details  Name: Anna Kennedy MRN: [id number] Date of Birth: Jun 05, 1950 Referring Provider: Darcus Austin  Encounter Date: 08/18/2015      PT End of Session - 08/18/15 1550    Visit Number 13   Number of Visits 16   Date for PT Re-Evaluation 09/08/15   PT Start Time 1435   PT Stop Time 1555   PT Time Calculation (min) 80 min   Activity Tolerance Patient tolerated treatment well   Behavior During Therapy [facility] for tasks assessed/performed      Past Medical History  Diagnosis Date  . Retinal tear     treated previously with laser surgery  . Atypical chest pain   . Other social stressor   . Osteoarthritis of both knees   . History of depression   . History of asthma   . Cancer ([place])     ovarian    Past Surgical History  Procedure Laterality Date  . Knee surgery      Of her knees  . Foot surgery      Right foot    There were no vitals filed for this visit.  Visit Diagnosis:  Lymphedema of both lower extremities      Subjective Assessment - 08/18/15 1436    Subjective I can not get these bandages to stay up. I have had to re wrap the top part of my leg twice since yesterday. My right leg is also blown up today.   Pertinent History Was diagnosed Nov 25,2013 with ovarian cancer. Pt underwent a total hysterectomy in Dec 2013 at that time no lymph nodes were involved. Pt is recieving chemotherapy infusions but did not require radiation. Now one lymph node is involved in right groin and pt has edema of  bilateral LEs with right worse than left. They initially thought it was cellulitis and she was hospitilized but later pt learned it was lymphedema. Pt has never had a DVT.   How long can you sit comfortably? approx 45 min to an hour, pt tries to get up every 20 minutes or so at work   Patient  Stated Goals to get LE swelling under control and get some relief   Currently in Pain? No/denies               LYMPHEDEMA/ONCOLOGY QUESTIONNAIRE - 08/18/15 1447    Left Lower Extremity Lymphedema   30 cm Proximal to Suprapatella 58 cm   20 cm Proximal to Suprapatella 47.5 cm   10 cm Proximal to Suprapatella 39.7 cm   At Midpatella/Popliteal Crease 38 cm   30 cm Proximal to Floor at Lateral Plantar Foot 31.5 cm   20 cm Proximal to Floor at Lateral Plantar Foot 25 cm   10 cm Proximal to Floor at Lateral Malleoli 21.4 cm   5 cm Proximal to 1st MTP Joint 20.4 cm   Across MTP Joint 22.5 cm   Around Proximal Great Toe 7.6 cm                  [facility] Adult PT Treatment/Exercise - 08/18/15 0001    Manual Therapy   Manual Therapy Manual Lymphatic Drainage (MLD);Compression Bandaging   Manual Lymphatic Drainage (MLD) Manual lymph drainage to BLE in supine: short neck, superficial and deep abdominals, bilateral axillae, bilateral inguino-axillary pathways; Left LE anterior lower abdomen, lateral hip  and then medial to lateral hip redirecting along pathway; left lower leg and ankle/dorsal foot redirecting along lateral hip and then pathways; repeated process with right LE; ended with bilateral axillae.    Compression Bandaging To left LE: lotion; thick stockinette from foot to knee and then knee to thigh; Artiflex to knee; 1-8 cm, 2-10 cm, and 4-12 cm short stretch compression bandages from foot to groin.                PT Education - 08/17/15 1655    Education provided Yes   Education Details Superficial and deep abdominal series with manual lyph drainage   Person(s) Educated Patient   Methods Explanation;Demonstration;Handout   Comprehension Verbalized understanding;Returned demonstration;Need further instruction           Short Term Clinic Goals - 08/18/15 1602    CC Short Term Goal  #1   Title Pt to demonstrate a 3 cm decrease of right thigh edema (20 cm above  suprapatella) to decrease risk of cellulitis   Baseline 51.6 cm; 54.5 cm 07/20/15 but the bandage had slid beneath this point and pt had great reductions below this causing increase fluid above, 3//16: 51; 08/03/15 50.4 cm;    Status On-going   CC Short Term Goal  #2   Title Pt to demonstrate a 2 cm decrease of right calf edema (30cm proximal to floor) to decrease risk of cellulitis   Status Achieved             Long Term Clinic Goals - 08/18/15 1602    CC Long Term Goal  #1   Title Pt to demonstrate a 6 cm decrease of right thigh edema (20 cm above suprapatella) to decrease risk of cellulitis   Baseline 51.6 cm   Status Achieved   CC Long Term Goal  #2   Title Pt to demonstrate a 4 cm decrease of right calf edema (30cm proximal to floor) to decrease risk of cellulitis   Status Achieved   CC Long Term Goal  #3   Title Pt to be receive with appropriate compression garments for day and night time use for long term management of edema   Baseline patient has received compression stockings for day time use, pt to be measured for night time garment, pt to receive trial of FlexiTouch compression pump   Status Partially Met   CC Long Term Goal  #4   Title Pt to verbalize signs/symptoms of infection in bilateral LEs and lymphedema precautions   Status Partially Met            Plan - 08/18/15 1551    Clinical Impression Statement Pt to be measured for a nighttime compression pending insurance. Awaiting email from [place]. Pt demonstrates a 6 cm decrease of upper thigh edema this session compared to last. Her RLE appears more swollen this day and she has been wearing her compression stocking but reports being on her feet a lot lately. Pt to receive trial of FlexiTouch compression pump for long term management of edema.    Pt will benefit from skilled therapeutic intervention in order to improve on the following deficits Increased edema   Rehab Potential Good   Clinical Impairments Affecting  Rehab Potential pt goes for chemotherapy infusions   PT Frequency 3x / week   PT Duration 8 weeks   PT Treatment/Interventions Vasopneumatic Device;Manual lymph drainage;Compression bandaging;Therapeutic exercise   PT Next Visit Plan MLD to BLEs and compression bandaging to LLE, assess RLE measurements  with use of stocking   PT Home Exercise Plan Self manual lymph drainage for Rt LE.   Consulted and Agree with Plan of Care Patient        Problem List Patient Active Problem List   Diagnosis Date Noted  . POSTNASAL DRIP 08/23/2009  . ASTHMA 08/01/2009  . ARTHRITIS 08/01/2009  . SHORTNESS OF BREATH (SOB) 08/01/2009    Alexia Freestone 08/18/2015, 4:04 PM  [place], [place], [address] Phone: [phone]   Fax:  [phone]  Name: Anna Kennedy MRN: [id number] Date of Birth: 1950-08-22    Allyson Sabal, PT 08/18/2015 4:04 PM

## 2015-08-22 ENCOUNTER — Ambulatory Visit: Payer: BLUE CROSS/BLUE SHIELD | Admitting: Physical Therapy

## 2015-08-22 ENCOUNTER — Encounter: Payer: Self-pay | Admitting: Physical Therapy

## 2015-08-22 DIAGNOSIS — I890 Lymphedema, not elsewhere classified: Secondary | ICD-10-CM

## 2015-08-22 NOTE — Therapy (Signed)
[place], [place], [address] Phone: [phone]   Fax:  [phone]  Physical Therapy Treatment  Patient Details  Name: Anna Kennedy MRN: [id number] Date of Birth: January 13, 1951 Referring Provider: Darcus Austin  Encounter Date: 08/22/2015      PT End of Session - 08/22/15 1714    Visit Number 14   Number of Visits 40   Date for PT Re-Evaluation 10/08/15   Authorization Type April cert completed   PT Start Time 1520   PT Stop Time 1645   PT Time Calculation (min) 85 min   Activity Tolerance Patient tolerated treatment well   Behavior During Therapy [facility] for tasks assessed/performed      Past Medical History  Diagnosis Date  . Retinal tear     treated previously with laser surgery  . Atypical chest pain   . Other social stressor   . Osteoarthritis of both knees   . History of depression   . History of asthma   . Cancer ([place])     ovarian    Past Surgical History  Procedure Laterality Date  . Knee surgery      Of her knees  . Foot surgery      Right foot    There were no vitals filed for this visit.      Subjective Assessment - 08/22/15 1522    Subjective I think my hernias have gotten worse since I started this.I think it may be from all the pulling to get the stocking off and on. My stomach bloating is just awful.    Pertinent History Was diagnosed Nov 25,2013 with ovarian cancer. Pt underwent a total hysterectomy in Dec 2013 at that time no lymph nodes were involved. Pt is recieving chemotherapy infusions but did not require radiation. Now one lymph node is involved in right groin and pt has edema of  bilateral LEs with right worse than left. They initially thought it was cellulitis and she was hospitilized but later pt learned it was lymphedema. Pt has never had a DVT.   How long can you sit comfortably? approx 45 min to an hour, pt tries to get up every 20 minutes or so at work   Patient Stated Goals to get LE swelling under control and get some relief   Currently in Pain? No/denies               LYMPHEDEMA/ONCOLOGY QUESTIONNAIRE - 08/22/15 1524    Right Lower Extremity Lymphedema   30 cm Proximal to Suprapatella 55.8 cm   20 cm Proximal to Suprapatella 49.2 cm   10 cm Proximal to Suprapatella 45.5 cm   At Midpatella/Popliteal Crease 39.5 cm   30 cm Proximal to Floor at Lateral Plantar Foot 37.8 cm   20 cm Proximal to Floor at Lateral Plantar Foot 28.5 1   10  cm Proximal to Floor at Lateral Malleoli 23.1 cm   5 cm Proximal to 1st MTP Joint 20.9 cm   Across MTP Joint 23.5 cm   Around Proximal Great Toe 7.7 cm                  [facility] Adult PT Treatment/Exercise - 08/22/15 0001    Manual Therapy   Manual Therapy Manual Lymphatic Drainage (MLD);Compression Bandaging   Manual Lymphatic Drainage (MLD) Manual lymph drainage to BLE in supine: short neck, superficial and deep abdominals, bilateral axillae, bilateral inguino-axillary pathways; Left LE anterior lower abdomen, lateral hip and then  medial to lateral hip redirecting along pathway; left lower leg and ankle/dorsal foot redirecting along lateral hip and then pathways; repeated process with right LE; ended with bilateral axillae.    Compression Bandaging To left LE: lotion; thick stockinette from foot to knee and then knee to thigh; Artiflex to knee; 1-8 cm, 2-10 cm, and 4-12 cm short stretch compression bandages from foot to groin.                   Short Term Clinic Goals - 08/18/15 1602    CC Short Term Goal  #1   Title Pt to demonstrate a 3 cm decrease of right thigh edema (20 cm above suprapatella) to decrease risk of cellulitis   Baseline 51.6 cm; 54.5 cm 07/20/15 but the bandage had slid beneath this point and pt had great reductions below this causing increase fluid above, 3//16: 51; 08/03/15 50.4 cm;    Status On-going   CC Short Term Goal  #2   Title Pt to demonstrate a 2 cm  decrease of right calf edema (30cm proximal to floor) to decrease risk of cellulitis   Status Achieved             Long Term Clinic Goals - 08/18/15 1602    CC Long Term Goal  #1   Title Pt to demonstrate a 6 cm decrease of right thigh edema (20 cm above suprapatella) to decrease risk of cellulitis   Baseline 51.6 cm   Status Achieved   CC Long Term Goal  #2   Title Pt to demonstrate a 4 cm decrease of right calf edema (30cm proximal to floor) to decrease risk of cellulitis   Status Achieved   CC Long Term Goal  #3   Title Pt to be receive with appropriate compression garments for day and night time use for long term management of edema   Baseline patient has received compression stockings for day time use, pt to be measured for night time garment, pt to receive trial of FlexiTouch compression pump   Status Partially Met   CC Long Term Goal  #4   Title Pt to verbalize signs/symptoms of infection in bilateral LEs and lymphedema precautions   Status Partially Met            Plan - 08/22/15 1715    Clinical Impression Statement Pt demonstrates good reduction of left lower extremity edema with use of compression bandages. Her right lower extremity is not being controlled with her compression stocking and her measurements have increased by as much as 3 cm. Pt to be measured for a night time garment next session. She will also be receiving a trial of the FlexiTouch compression pump tomorrow. Pt would benefit from continued skilled PT services to reach maximal reduction of bilateral LE edema and for pt to receive appropriate compression garments.    Rehab Potential Good   Clinical Impairments Affecting Rehab Potential pt goes for chemotherapy infusions   PT Frequency 3x / week   PT Duration 8 weeks   PT Treatment/Interventions Vasopneumatic Device;Manual lymph drainage;Compression bandaging;Therapeutic exercise   PT Next Visit Plan MLD to BLEs and compression bandaging to RLE again,  see if LLE can now be maintained with compression stocking, take measurements of LLE   Consulted and Agree with Plan of Care Patient      Patient will benefit from skilled therapeutic intervention in order to improve the following deficits and impairments:  Increased edema  Visit Diagnosis: Lymphedema, not  elsewhere classified - Plan: PT plan of care cert/re-cert     Problem List Patient Active Problem List   Diagnosis Date Noted  . POSTNASAL DRIP 08/23/2009  . ASTHMA 08/01/2009  . ARTHRITIS 08/01/2009  . SHORTNESS OF BREATH (SOB) 08/01/2009    Alexia Freestone 08/22/2015, 5:23 PM  [place], [place], [address] Phone: [phone]   Fax:  [phone]  Name: MAYSA LYNN MRN: [id number] Date of Birth: 14-Mar-1951    Allyson Sabal, PT 08/22/2015 5:23 PM

## 2015-08-24 ENCOUNTER — Ambulatory Visit: Payer: BLUE CROSS/BLUE SHIELD

## 2015-08-24 DIAGNOSIS — I890 Lymphedema, not elsewhere classified: Secondary | ICD-10-CM | POA: Diagnosis not present

## 2015-08-24 NOTE — Therapy (Signed)
[place], [place], [address] Phone: [phone]   Fax:  [phone]  Physical Therapy Treatment  Patient Details  Name: Anna Kennedy MRN: [id number] Date of Birth: Apr 22, 1951 Referring Provider: Darcus Austin  Encounter Date: 08/24/2015      PT End of Session - 08/24/15 1707    Visit Number 15   Number of Visits 40   Date for PT Re-Evaluation 10/08/15   PT Start Time 1527   PT Stop Time 1657   PT Time Calculation (min) 90 min   Activity Tolerance Patient tolerated treatment well   Behavior During Therapy [facility] for tasks assessed/performed      Past Medical History  Diagnosis Date  . Retinal tear     treated previously with laser surgery  . Atypical chest pain   . Other social stressor   . Osteoarthritis of both knees   . History of depression   . History of asthma   . Cancer ([place])     ovarian    Past Surgical History  Procedure Laterality Date  . Knee surgery      Of her knees  . Foot surgery      Right foot    There were no vitals filed for this visit.      Subjective Assessment - 08/24/15 1530    Subjective Wore the abdominal binder at my abdomen today and have been doing the superficial and deep abdominal sequences an dmy stomach felt a little softer today and I was urinating more. Bobbi from [employer] gave you the demo and he is ordering that for me. Takes about a month to get here.     Pertinent History Was diagnosed Nov 25,2013 with ovarian cancer. Pt underwent a total hysterectomy in Dec 2013 at that time no lymph nodes were involved. Pt is recieving chemotherapy infusions but did not require radiation. Now one lymph node is involved in right groin and pt has edema of  bilateral LEs with right worse than left. They initially thought it was cellulitis and she was hospitilized but later pt learned it was lymphedema. Pt has never had a DVT.   Patient Stated Goals to get LE  swelling under control and get some relief   Currently in Pain? No/denies               LYMPHEDEMA/ONCOLOGY QUESTIONNAIRE - 08/24/15 1540    Right Lower Extremity Lymphedema   30 cm Proximal to Suprapatella 55.9 cm   20 cm Proximal to Suprapatella 52.8 cm   10 cm Proximal to Suprapatella 47.5 cm   At Midpatella/Popliteal Crease 39 cm   30 cm Proximal to Floor at Lateral Plantar Foot 37.2 cm   20 cm Proximal to Floor at Lateral Plantar Foot 28._0 cm Proximal to Floor at Lateral Malleoli 23.2 cm   5 cm Proximal to 1st MTP Joint 21.4 cm   Across MTP Joint 22.9 cm   Around Proximal Great Toe 7.4 cm   Left Lower Extremity Lymphedema   30 cm Proximal to Suprapatella 57.2 cm   20 cm Proximal to Suprapatella 53.4 cm   10 cm Proximal to Suprapatella 46.5 cm   At Midpatella/Popliteal Crease 37.6 cm   30 cm Proximal to Floor at Lateral Plantar Foot 31.3 cm   20 cm Proximal to Floor at Lateral Plantar Foot 23.8 cm   10 cm Proximal to Floor at Lateral Malleoli 21.2 cm   5 cm  Proximal to 1st MTP Joint 20.6 cm   Across MTP Joint 22 cm   Around Proximal Great Toe 7.1 cm                  [facility] Adult PT Treatment/Exercise - 08/24/15 0001    Manual Therapy   Manual Lymphatic Drainage (MLD) Manual lymph drainage to BLE in supine: short neck, superficial and deep abdominals (reviewing this sequence with pt while performing), bilateral axillae, bilateral inguino-axillary pathways; Left LE anterior lower abdomen, lateral hip and then medial to lateral hip redirecting along pathway; left lower leg and ankle/dorsal foot redirecting along lateral hip and then pathways; repeated process with right LE; ended with bilateral axillae.    Compression Bandaging To left LE: Biotone lotion up to mid thigh and placed 2 strips of NonSlip Medi liner at anterior upper thigh and 1 piece at posterior upper thigh; thick stockinette from foot to knee and then thin stockinette knee to thigh; Artiflex x2  to knee; 1-8 cm, 2-10 cm, and from knee to thigh Artiflex x1 with 2 pieces of 1/4" gray foam for anterior and posterior thigh, and 3-12 cm (herring bone with 2nd 12 cm) short stretch compression bandages to groin.                   Short Term Clinic Goals - 08/24/15 1713    CC Short Term Goal  #1   Title Pt to demonstrate a 3 cm decrease of right thigh edema (20 cm above suprapatella) to decrease risk of cellulitis   Baseline 51.6 cm; 54.5 cm 07/20/15 but the bandage had slid beneath this point and pt had great reductions below this causing increase fluid above, 3//16: 51; 08/03/15 50.4 cm; 52.8 08/24/15 so resumed Rt LE bandaging today   Status On-going             Long Term Clinic Goals - 08/24/15 1714    CC Long Term Goal  #3   Title Pt to be receive with appropriate compression garments for day and night time use for long term management of edema   Status Partially Met   CC Long Term Goal  #4   Title Pt to verbalize signs/symptoms of infection in bilateral LEs and lymphedema precautions   Status Partially Met            Plan - 08/24/15 1708    Clinical Impression Statement Though pt demonstrated increase circumference at bil thigh arease, she wanted to go back to wrapping her Rt LE today as she reports has been slowly noticing it increasing in circumference so added new foam pieces to her upper thigh and she will be back for appt tomorrow and we can see how this went. Donned pts Lt LE thigh high compression garment at end of session. She had her Flexitouch demonstration and has already gotten that ordered so she looks forward to having something else to help her control her lymphedema.    Rehab Potential Good   Clinical Impairments Affecting Rehab Potential pt goes for chemotherapy infusions   PT Frequency 3x / week   PT Duration 8 weeks   PT Treatment/Interventions Vasopneumatic Device;Manual lymph drainage;Compression bandaging;Therapeutic exercise   PT Next Visit  Plan MLD to BLEs and compression bandaging to RLE again, see how Lt LE did with compression stocking and how Rt LE bandage did with new foam pieces.    Consulted and Agree with Plan of Care Patient      Patient will  benefit from skilled therapeutic intervention in order to improve the following deficits and impairments:  Increased edema  Visit Diagnosis: Lymphedema, not elsewhere classified     Problem List Patient Active Problem List   Diagnosis Date Noted  . POSTNASAL DRIP 08/23/2009  . ASTHMA 08/01/2009  . ARTHRITIS 08/01/2009  . SHORTNESS OF BREATH (SOB) 08/01/2009    Otelia Limes, PTA 08/24/2015, 5:15 PM  [place], [place], [address] Phone: [phone]   Fax:  [phone]  Name: ADRA SHEPLER MRN: [id number] Date of Birth: 07-13-1950

## 2015-08-25 ENCOUNTER — Ambulatory Visit: Payer: BLUE CROSS/BLUE SHIELD | Admitting: Physical Therapy

## 2015-08-25 DIAGNOSIS — I890 Lymphedema, not elsewhere classified: Secondary | ICD-10-CM | POA: Diagnosis not present

## 2015-08-25 NOTE — Therapy (Signed)
[place], [place], [address] Phone: [phone]   Fax:  [phone]  Physical Therapy Treatment  Patient Details  Name: Anna Kennedy MRN: [id number] Date of Birth: August 31, 1950 Referring Provider: Darcus Austin  Encounter Date: 08/25/2015      PT End of Session - 08/25/15 1744    Visit Number 16   Number of Visits 40   Date for PT Re-Evaluation 10/08/15   Authorization Type April cert completed   PT Start Time 1525   PT Stop Time 1655   PT Time Calculation (min) 90 min   Activity Tolerance Patient tolerated treatment well   Behavior During Therapy [facility] for tasks assessed/performed      Past Medical History  Diagnosis Date  . Retinal tear     treated previously with laser surgery  . Atypical chest pain   . Other social stressor   . Osteoarthritis of both knees   . History of depression   . History of asthma   . Cancer ([place])     ovarian    Past Surgical History  Procedure Laterality Date  . Knee surgery      Of her knees  . Foot surgery      Right foot    There were no vitals filed for this visit.      Subjective Assessment - 08/25/15 1735    Subjective Pt is going for another chemo tomorrow.  She came in with stocking on that stayed up since yesterday .  She wants to decrease to at least 2x per week or less as she has $25 copay each visit and will need to pay $500 for Flexitouch.  She wouldlike to move forward with CircAid as long as insurance will pay.   Pertinent History Was diagnosed Nov 25,2013 with ovarian cancer. Pt underwent a total hysterectomy in Dec 2013 at that time no lymph nodes were involved. Pt is recieving chemotherapy infusions but did not require radiation. Now one lymph node is involved in right groin and pt has edema of  bilateral LEs with right worse than left. They initially thought it was cellulitis and she was hospitilized but later pt learned it was lymphedema.  Pt has never had a DVT.   Patient Stated Goals to get LE swelling under control and get some relief   Currently in Pain? No/denies               LYMPHEDEMA/ONCOLOGY QUESTIONNAIRE - 08/25/15 1542    Right Lower Extremity Lymphedema   30 cm Proximal to Suprapatella 58.5 cm   20 cm Proximal to Suprapatella 52 cm   10 cm Proximal to Suprapatella 46.3 cm   At Midpatella/Popliteal Crease 38 cm   30 cm Proximal to Floor at Lateral Plantar Foot 34 cm   20 cm Proximal to Floor at Lateral Plantar Foot 27.5 1   10  cm Proximal to Floor at Lateral Malleoli 22.5 cm   5 cm Proximal to 1st MTP Joint 21.8 cm   Around Proximal Great Toe 7.4 cm                  [facility] Adult PT Treatment/Exercise - 08/25/15 0001    Self-Care   Other Self-Care Comments  pt brought in the donning butler tha she just received and assisted her to apply her stocking to left foot with min assist    Manual Therapy   Manual Lymphatic Drainage (MLD) Manual lymph drainage to BLE in  supine: short neck, superficial and deep abdominals bilateral axillae, bilateral inguino-axillary pathways; Left LE anterior lower abdomen, lateral hip and then medial to lateral hip redirecting along pathway; left lower leg and ankle/dorsal foot redirecting along lateral hip and then pathways; repeated process with right LE; ended with bilateral axillae. to left sidelying before doing right leg for decongestion of left hip to back.    Compression Bandaging To left LE: Biotone lotion up to mid thigh and placed 2 strips of NonSlip Medi liner at anterior upper thigh and 1 piece at posterior upper thigh; thick stockinette from foot to knee and then thin stockinette knee to thigh; Artiflex x2 to knee; 1-8 cm, 2-10 cm, and from knee to thigh Artiflex x1 with 2 pieces of 1/4" gray foam for anterior and posterior thigh, and 3-12 cm (herring bone with 2nd 12 cm) short stretch compression bandages to groin.                   Short Term  Clinic Goals - 08/24/15 1713    CC Short Term Goal  #1   Title Pt to demonstrate a 3 cm decrease of right thigh edema (20 cm above suprapatella) to decrease risk of cellulitis   Baseline 51.6 cm; 54.5 cm 07/20/15 but the bandage had slid beneath this point and pt had great reductions below this causing increase fluid above, 3//16: 51; 08/03/15 50.4 cm; 52.8 08/24/15 so resumed Rt LE bandaging today   Status On-going             Long Term Clinic Goals - 08/24/15 1714    CC Long Term Goal  #3   Title Pt to be receive with appropriate compression garments for day and night time use for long term management of edema   Status Partially Met   CC Long Term Goal  #4   Title Pt to verbalize signs/symptoms of infection in bilateral LEs and lymphedema precautions   Status Partially Met            Plan - 08/25/15 1745    Clinical Impression Statement Pt gets good reduction in right leg with compression but finds it challenging to wear.  Upgrades to bandaging technique improved her ability to keep bandage up.  She is able to don her compression stocking to right leg with donning bulter with min assist and should reach modified independence with practice soon.  She wants to get the circaids and move to indpendence wih it, Flexotouch and compression stocking as soon as possilble    Rehab Potential Good   Clinical Impairments Affecting Rehab Potential pt goes for chemotherapy infusions   PT Frequency --  decrease to 2x per week    PT Next Visit Plan MLD to BLEs and compression bandaging to RLE again, move toward decreasing to 2x per week with pt bandaging leg at night and wearing compression stocking during the day when bandage placed here is removed    Consulted and Agree with Plan of Care Patient      Patient will benefit from skilled therapeutic intervention in order to improve the following deficits and impairments:  Increased edema  Visit Diagnosis: Lymphedema, not elsewhere  classified     Problem List Patient Active Problem List   Diagnosis Date Noted  . POSTNASAL DRIP 08/23/2009  . ASTHMA 08/01/2009  . ARTHRITIS 08/01/2009  . SHORTNESS OF BREATH (SOB) 08/01/2009   Donato Heinz. Owens Shark, PT  08/25/2015, 5:50 PM  [address]  [place], [place], [address] Phone: [phone]   Fax:  [phone]  Name: Anna Kennedy MRN: [id number] Date of Birth: 19-Oct-1950

## 2015-08-29 ENCOUNTER — Ambulatory Visit: Payer: BLUE CROSS/BLUE SHIELD | Admitting: Physical Therapy

## 2015-08-29 ENCOUNTER — Encounter: Payer: Self-pay | Admitting: Physical Therapy

## 2015-08-29 DIAGNOSIS — I890 Lymphedema, not elsewhere classified: Secondary | ICD-10-CM | POA: Diagnosis not present

## 2015-08-29 NOTE — Therapy (Signed)
[place], [place], [address] Phone: [phone]   Fax:  [phone]  Physical Therapy Treatment  Patient Details  Name: Anna Kennedy MRN: [id number] Date of Birth: 06-May-1951 Referring Provider: Darcus Austin  Encounter Date: 08/29/2015      PT End of Session - 08/29/15 1554    Visit Number 17   Number of Visits 40   Date for PT Re-Evaluation 10/08/15   Authorization Type April cert completed   PT Start Time 1430   PT Stop Time 1557   PT Time Calculation (min) 87 min   Activity Tolerance Patient tolerated treatment well   Behavior During Therapy [facility] for tasks assessed/performed      Past Medical History  Diagnosis Date  . Retinal tear     treated previously with laser surgery  . Atypical chest pain   . Other social stressor   . Osteoarthritis of both knees   . History of depression   . History of asthma   . Cancer ([place])     ovarian    Past Surgical History  Procedure Laterality Date  . Knee surgery      Of her knees  . Foot surgery      Right foot    There were no vitals filed for this visit.      Subjective Assessment - 08/29/15 1450    Subjective My abdominal swelling is a little bit better with the waist trainer but now it seems the fluid is going to my butt.    Pertinent History Was diagnosed Nov 25,2013 with ovarian cancer. Pt underwent a total hysterectomy in Dec 2013 at that time no lymph nodes were involved. Pt is recieving chemotherapy infusions but did not require radiation. Now one lymph node is involved in right groin and pt has edema of  bilateral LEs with right worse than left. They initially thought it was cellulitis and she was hospitilized but later pt learned it was lymphedema. Pt has never had a DVT.   How long can you sit comfortably? approx 45 min to an hour, pt tries to get up every 20 minutes or so at work   Patient Stated Goals to get LE swelling under  control and get some relief   Currently in Pain? No/denies               LYMPHEDEMA/ONCOLOGY QUESTIONNAIRE - 08/29/15 1449    Left Lower Extremity Lymphedema   Other abdomen: 81.5 cm measured at umbilicus between hernias                  [facility] Adult PT Treatment/Exercise - 08/29/15 0001    Manual Therapy   Manual Lymphatic Drainage (MLD) Manual lymph drainage to BLE in supine: short neck, superficial and deep abdominals bilateral axillae, bilateral inguino-axillary pathways; Left LE anterior lower abdomen, lateral hip and then medial to lateral hip redirecting along pathway; left lower leg and ankle/dorsal foot redirecting along lateral hip and then pathways; repeated process with right LE; ended with bilateral axillae. to left sidelying before doing right leg for decongestion of left hip to back.    Compression Bandaging To left LE: Biotone lotion up to mid thigh and placed 2 strips of NonSlip Medi liner at anterior upper thigh and 1 piece at posterior upper thigh; thick stockinette from foot to knee and then thin stockinette knee to thigh; Artiflex x2 to knee; 1-8 cm, 2-10 cm, and from knee to thigh Artiflex x2  and 3-12 cm (herring bone with 2nd 12 cm) short stretch compression bandages to groin.                   Short Term Clinic Goals - 08/24/15 1713    CC Short Term Goal  #1   Title Pt to demonstrate a 3 cm decrease of right thigh edema (20 cm above suprapatella) to decrease risk of cellulitis   Baseline 51.6 cm; 54.5 cm 07/20/15 but the bandage had slid beneath this point and pt had great reductions below this causing increase fluid above, 3//16: 51; 08/03/15 50.4 cm; 52.8 08/24/15 so resumed Rt LE bandaging today   Status On-going             Long Term Clinic Goals - 08/24/15 1714    CC Long Term Goal  #3   Title Pt to be receive with appropriate compression garments for day and night time use for long term management of edema   Status Partially Met    CC Long Term Goal  #4   Title Pt to verbalize signs/symptoms of infection in bilateral LEs and lymphedema precautions   Status Partially Met            Plan - 08/29/15 1555    Clinical Impression Statement Pt is becoming very frustrated with abdominal edema and now she feels her glutes are swelling. A measurement was taken today around pt's abdomen to keep track of abdominal swelling. Pt was encourage to discuss the swelling with her doctos. She does not think it has gotten worse since she began compression bandaging but states temporarily it increases but then goes back down. It also has not gotten any better.    Rehab Potential Good   Clinical Impairments Affecting Rehab Potential pt goes for chemotherapy infusions   PT Frequency 2x / week   PT Duration 8 weeks   PT Treatment/Interventions Vasopneumatic Device;Manual lymph drainage;Compression bandaging;Therapeutic exercise   PT Next Visit Plan MLD to BLEs and compression bandaging to RLE again   Consulted and Agree with Plan of Care Patient      Patient will benefit from skilled therapeutic intervention in order to improve the following deficits and impairments:  Increased edema  Visit Diagnosis: Lymphedema, not elsewhere classified     Problem List Patient Active Problem List   Diagnosis Date Noted  . POSTNASAL DRIP 08/23/2009  . ASTHMA 08/01/2009  . ARTHRITIS 08/01/2009  . SHORTNESS OF BREATH (SOB) 08/01/2009    Alexia Freestone 08/29/2015, 3:58 PM  [place], [place], [address] Phone: [phone]   Fax:  [phone]  Name: MARLAYA TURCK MRN: [id number] Date of Birth: 09-30-1950    Allyson Sabal, PT 08/29/2015 3:58 PM

## 2015-09-02 ENCOUNTER — Ambulatory Visit: Payer: BLUE CROSS/BLUE SHIELD | Admitting: Physical Therapy

## 2015-09-02 DIAGNOSIS — I890 Lymphedema, not elsewhere classified: Secondary | ICD-10-CM | POA: Diagnosis not present

## 2015-09-02 NOTE — Therapy (Signed)
[place], [place], [address] Phone: [phone]   Fax:  [phone]  Physical Therapy Treatment  Patient Details  Name: Anna Kennedy MRN: [id number] Date of Birth: [birth year] Referring Provider: Darcus Austin  Encounter Date: 09/02/2015      PT End of Session - 09/02/15 1213    Visit Number 18   Number of Visits 40   Date for PT Re-Evaluation 10/08/15   Authorization Type April cert completed   PT Start Time 0802   PT Stop Time 0930   PT Time Calculation (min) 88 min   Activity Tolerance Patient tolerated treatment well   Behavior During Therapy [facility] for tasks assessed/performed      Past Medical History  Diagnosis Date  . Retinal tear     treated previously with laser surgery  . Atypical chest pain   . Other social stressor   . Osteoarthritis of both knees   . History of depression   . History of asthma   . Cancer ([place])     ovarian    Past Surgical History  Procedure Laterality Date  . Knee surgery      Of her knees  . Foot surgery      Right foot    There were no vitals filed for this visit.      Subjective Assessment - 09/02/15 1208    Subjective Pt reports she is getting discouraged.  She says the lymphedema is the hardest struggle she has had with the cancer.  It takes a lot of effort and energy to keep up the wrapping.    Pertinent History Was diagnosed Nov 25,2013 with ovarian cancer. Pt underwent a total hysterectomy in Dec 2013 at that time no lymph nodes were involved. Pt is recieving chemotherapy infusions but did not require radiation. Now one lymph node is involved in right groin and pt has edema of  bilateral LEs with right worse than left. They initially thought it was cellulitis and she was hospitilized but later pt learned it was lymphedema. Pt has never had a DVT.   Patient Stated Goals to get LE swelling under control and get some relief   Currently in Pain?  No/denies                         [facility] Adult PT Treatment/Exercise - 09/02/15 0001    Manual Therapy   Manual Lymphatic Drainage (MLD) Manual lymph drainage to BLE in supine: short neck, superficial and deep abdominals bilateral axillae, bilateral inguino-axillary pathways; Left LE anterior lower abdomen, lateral hip and then medial to lateral hip redirecting along pathway; left lower leg and ankle/dorsal foot redirecting along lateral hip and then pathways; then to sidlying for lateral hip an back with encouragemen for pt to do 5 reps of hip abduciton and knee flexion.   repeated process with right LE; ended with bilateral axillae. to left sidelying before doing right leg for decongestion of left hip to back.    Compression Bandaging To left LE: Biotone lotion up to mid thigh and placed 2 strips of NonSlip Medi liner at anterior upper thigh and 1 piece at posterior upper thigh; thick stockinette from foot to knee and then thin stockinette knee to thigh; Artiflex x2 to knee; 1-8 cm, 2-10 cm, and from knee to thigh Artiflex x2  and 3-12 cm (herring bone with 2nd 12 cm) short stretch compression bandages to groin.  Short Term Clinic Goals - 08/24/15 1713    CC Short Term Goal  #1   Title Pt to demonstrate a 3 cm decrease of right thigh edema (20 cm above suprapatella) to decrease risk of cellulitis   Baseline 51.6 cm; 54.5 cm 07/20/15 but the bandage had slid beneath this point and pt had great reductions below this causing increase fluid above, 3//16: 51; 08/03/15 50.4 cm; 52.8 08/24/15 so resumed Rt LE bandaging today   Status On-going             Long Term Clinic Goals - 08/24/15 1714    CC Long Term Goal  #3   Title Pt to be receive with appropriate compression garments for day and night time use for long term management of edema   Status Partially Met   CC Long Term Goal  #4   Title Pt to verbalize signs/symptoms of infection in bilateral LEs  and lymphedema precautions   Status Partially Met            Plan - 09/02/15 1213    Clinical Impression Statement Pt came in with bandaging on right leg.  She seemed to do well this week wih bandaging at night and compression stocking during the day, but she is getting frusrated with the process.  She is anxious to get back to regular shoes and being able to exericse.    Rehab Potential Good   Clinical Impairments Affecting Rehab Potential pt goes for chemotherapy infusions   PT Next Visit Plan MLD to BLEs and compression bandaging to RLE again      Patient will benefit from skilled therapeutic intervention in order to improve the following deficits and impairments:     Visit Diagnosis: Lymphedema, not elsewhere classified     Problem List Patient Active Problem List   Diagnosis Date Noted  . POSTNASAL DRIP 08/23/2009  . ASTHMA 08/01/2009  . ARTHRITIS 08/01/2009  . SHORTNESS OF BREATH (SOB) 08/01/2009   Donato Heinz. Owens Shark, PT 09/02/2015, 12:19 PM  [place], [place], [address] Phone: [phone]   Fax:  [phone]  Name: Anna Kennedy MRN: [id number] Date of Birth: Sep 06, 1950

## 2015-09-05 ENCOUNTER — Encounter: Payer: Self-pay | Admitting: Physical Therapy

## 2015-09-05 ENCOUNTER — Ambulatory Visit: Payer: BLUE CROSS/BLUE SHIELD | Admitting: Physical Therapy

## 2015-09-05 DIAGNOSIS — I890 Lymphedema, not elsewhere classified: Secondary | ICD-10-CM | POA: Diagnosis not present

## 2015-09-05 NOTE — Therapy (Signed)
[place], [place], [address] Phone: [phone]   Fax:  [phone]  Physical Therapy Treatment  Patient Details  Name: Anna Kennedy MRN: [id number] Date of Birth: May 09, 1951 Referring Provider: Darcus Austin  Encounter Date: 09/05/2015      PT End of Session - 09/05/15 1657    Visit Number 19   Number of Visits 40   Date for PT Re-Evaluation 10/08/15   Authorization Type April cert completed   PT Start Time 1524   PT Stop Time 1653   PT Time Calculation (min) 89 min   Activity Tolerance Patient tolerated treatment well   Behavior During Therapy [facility] for tasks assessed/performed      Past Medical History  Diagnosis Date  . Retinal tear     treated previously with laser surgery  . Atypical chest pain   . Other social stressor   . Osteoarthritis of both knees   . History of depression   . History of asthma   . Cancer ([place])     ovarian    Past Surgical History  Procedure Laterality Date  . Knee surgery      Of her knees  . Foot surgery      Right foot    There were no vitals filed for this visit.      Subjective Assessment - 09/05/15 1526    Subjective Pt states she has been walking in the pool at the [facility] twice since her last visit. She received her velcro compression garments from DME provider and has been wearing them since Sunday.   Pertinent History Was diagnosed Nov 25,2013 with ovarian cancer. Pt underwent a total hysterectomy in Dec 2013 at that time no lymph nodes were involved. Pt is recieving chemotherapy infusions but did not require radiation. Now one lymph node is involved in right groin and pt has edema of  bilateral LEs with right worse than left. They initially thought it was cellulitis and she was hospitilized but later pt learned it was lymphedema. Pt has never had a DVT.   How long can you sit comfortably? approx 45 min to an hour, pt tries to get up every 20 minutes  or so at work   Patient Stated Goals to get LE swelling under control and get some relief   Currently in Pain? No/denies  pt very uncomfortable behind knee and around ankles                         [facility] Adult PT Treatment/Exercise - 09/05/15 0001    Manual Therapy   Manual Lymphatic Drainage (MLD) Manual lymph drainage to BLE in supine: short neck, superficial and deep abdominals bilateral axillae, bilateral inguino-axillary pathways; Left LE anterior lower abdomen, lateral hip and then medial to lateral hip redirecting along pathway; left lower leg and ankle/dorsal foot redirecting along lateral hip and then pathways; then to prone for posterior leg and posterior knee moving laterally and re directing towrads pathways repeated process with right LE; ended with bilateral axillae.    Compression Bandaging donned pt's circaid to upper and lower legs while instructing patient how to don/doff                PT Education - 09/05/15 1655    Education provided Yes   Education Details how to don/doff circaid compression garments and how to measure for appropriate compression level   Person(s) Educated Patient   Methods  Explanation;Demonstration;Verbal cues   Comprehension Verbalized understanding           Short Term Clinic Goals - 08/24/15 1713    CC Short Term Goal  #1   Title Pt to demonstrate a 3 cm decrease of right thigh edema (20 cm above suprapatella) to decrease risk of cellulitis   Baseline 51.6 cm; 54.5 cm 07/20/15 but the bandage had slid beneath this point and pt had great reductions below this causing increase fluid above, 3//16: 51; 08/03/15 50.4 cm; 52.8 08/24/15 so resumed Rt LE bandaging today   Status On-going             Long Term Clinic Goals - 08/24/15 1714    CC Long Term Goal  #3   Title Pt to be receive with appropriate compression garments for day and night time use for long term management of edema   Status Partially Met   CC Long  Term Goal  #4   Title Pt to verbalize signs/symptoms of infection in bilateral LEs and lymphedema precautions   Status Partially Met            Plan - 09/05/15 1658    Clinical Impression Statement Patient has CircAid upper and lower velcro compression garment for RLE and attempted donning independently prior to this visit. When CircAid was removed pt had large divots in her skin where some straps were donned too tightly. Pt educated how to use card inlcuding with garments to measure for appropriate compression level. MLD also performed in prone today because pt had increased discomfort in posterior knees. This may have been due to the compression garment being incorrectly donned. Pt did state she felt relief following MLD.    Rehab Potential Good   Clinical Impairments Affecting Rehab Potential pt goes for chemotherapy infusions   PT Frequency 2x / week   PT Duration 8 weeks   PT Treatment/Interventions Vasopneumatic Device;Manual lymph drainage;Compression bandaging;Therapeutic exercise   PT Next Visit Plan MLD to BLEs and compression garment to RLE - assess if CircAid is donned appropriately - order CircAid for LLE, assess goals   PT Home Exercise Plan Self manual lymph drainage for Rt LE.   Consulted and Agree with Plan of Care Patient      Patient will benefit from skilled therapeutic intervention in order to improve the following deficits and impairments:  Increased edema  Visit Diagnosis: Lymphedema, not elsewhere classified     Problem List Patient Active Problem List   Diagnosis Date Noted  . POSTNASAL DRIP 08/23/2009  . ASTHMA 08/01/2009  . ARTHRITIS 08/01/2009  . SHORTNESS OF BREATH (SOB) 08/01/2009    Alexia Freestone 09/05/2015, 5:01 PM  [place], [place], [address] Phone: [phone]   Fax:  [phone]  Name: Anna Kennedy MRN: [id number] Date of Birth:  Apr 30, 1951    Allyson Sabal, PT 09/05/2015 5:01 PM

## 2015-09-07 ENCOUNTER — Telehealth: Payer: Self-pay | Admitting: Physical Therapy

## 2015-09-07 NOTE — Telephone Encounter (Signed)
Pt stating she has been having ankle pain that is radiating in to her leg. She has been wearing her CircAid compression garments and does not feel like the pain is a superficial pain from irritation but rather a deep pain. Pt was wondering if she should see an orthopedic doctor. She states she has had a stress fracture before and feels that may be what is going on. She has also begun increasing her activity significantly lately and has been performing water aerobics. She reported some bruising when she removed CircAid and felt that it was not donned too tightly. Pt encouraged to reach out to orthopedic doctor regarding new onset of ankle pain. Will assess pt's ability to correctly don garments during next visit.

## 2015-09-09 ENCOUNTER — Encounter: Payer: Self-pay | Admitting: Physical Therapy

## 2015-09-09 ENCOUNTER — Ambulatory Visit: Payer: BLUE CROSS/BLUE SHIELD | Admitting: Physical Therapy

## 2015-09-09 DIAGNOSIS — I890 Lymphedema, not elsewhere classified: Secondary | ICD-10-CM

## 2015-09-09 NOTE — Therapy (Signed)
[place], [place], [address] Phone: [phone]   Fax:  [phone]  Physical Therapy Treatment  Patient Details  Name: Anna Kennedy MRN: [id number] Date of Birth: 14-Sep-1950 Referring Provider: Darcus Austin  Encounter Date: 09/09/2015      PT End of Session - 09/09/15 1039    Visit Number 20   Number of Visits 40   Date for PT Re-Evaluation 10/08/15   Authorization Type April cert completed   PT Start Time 0803   PT Stop Time 0930   PT Time Calculation (min) 87 min   Activity Tolerance Patient tolerated treatment well   Behavior During Therapy [facility] for tasks assessed/performed      Past Medical History  Diagnosis Date  . Retinal tear     treated previously with laser surgery  . Atypical chest pain   . Other social stressor   . Osteoarthritis of both knees   . History of depression   . History of asthma   . Cancer ([place])     ovarian    Past Surgical History  Procedure Laterality Date  . Knee surgery      Of her knees  . Foot surgery      Right foot    There were no vitals filed for this visit.             LYMPHEDEMA/ONCOLOGY QUESTIONNAIRE - 09/09/15 0820    Right Lower Extremity Lymphedema   30 cm Proximal to Suprapatella 58.5 cm   20 cm Proximal to Suprapatella 50.9 cm   10 cm Proximal to Suprapatella 45.4 cm   At Midpatella/Popliteal Crease 40 cm   30 cm Proximal to Floor at Lateral Plantar Foot 34.5 cm   20 cm Proximal to Floor at Lateral Plantar Foot 27 1   10  cm Proximal to Floor at Lateral Malleoli 23.2 cm   5 cm Proximal to 1st MTP Joint 21.5 cm   Around Proximal Great Toe 8 cm   Left Lower Extremity Lymphedema   Other abdomen: 91.5 cm measured at umbilicus between hernias                  [facility] Adult PT Treatment/Exercise - 09/09/15 0001    Manual Therapy   Manual Lymphatic Drainage (MLD) Manual lymph drainage to BLE in supine: short neck,  superficial and deep abdominals bilateral axillae, bilateral inguino-axillary pathways; Left LE anterior lower abdomen, lateral hip and then medial to lateral hip redirecting along pathway; left lower leg and ankle/dorsal foot redirecting along lateral hip and then pathways; then to prone for posterior leg and posterior knee moving laterally and re directing towrads pathways repeated process with right LE; ended with bilateral axillae.    Compression Bandaging pt donned compression hose to right LE                   Short Term Clinic Goals - 09/09/15 1046    CC Short Term Goal  #1   Title Pt to demonstrate a 3 cm decrease of right thigh edema (20 cm above suprapatella) to decrease risk of cellulitis   Baseline 51.6 cm; 54.5 cm 07/20/15 but the bandage had slid beneath this point and pt had great reductions below this causing increase fluid above, 3//16: 51; 08/03/15 50.4 cm; 52.8 08/24/15 so resumed Rt LE bandaging today, 09/09/15-50.9   Time 4   Period Weeks   Status On-going   CC Short Term Goal  #2  Title Pt to demonstrate a 2 cm decrease of right calf edema (30cm proximal to floor) to decrease risk of cellulitis   Baseline 38 cm; 36.2 cm 07/20/15, 32 cm on 3//16/2017   Time 4   Period Weeks   Status Achieved             Long Term Clinic Goals - 09/09/15 1048    CC Long Term Goal  #1   Title Pt to demonstrate a 6 cm decrease of right thigh edema (20 cm above suprapatella) to decrease risk of cellulitis   Baseline 51.6 cm   Time 8   Period Weeks   Status Achieved   CC Long Term Goal  #2   Title Pt to demonstrate a 4 cm decrease of right calf edema (30cm proximal to floor) to decrease risk of cellulitis   Baseline 38, 32 cm on 07/28/2015   Time 8   Period Weeks   Status Achieved   CC Long Term Goal  #3   Title Pt to be receive with appropriate compression garments for day and night time use for long term management of edema   Baseline patient has received compression  stockings for day time use, pt to be measured for night time garment, pt to receive trial of FlexiTouch compression pump, 09/09/15- pt to receive compression pump on Monday, she has a nighttime garment for RLE, circular knit compression thigh highs for BLE and was measured today for flat knit compression thigh highs for better containment   Time 8   Period Weeks   Status Partially Met   CC Long Term Goal  #4   Title Pt to verbalize signs/symptoms of infection in bilateral LEs and lymphedema precautions   Baseline none   Time 8   Period Weeks   Status Partially Met            Plan - 09/09/15 1040    Clinical Impression Statement Patient has been having pain around her right ankle and lower leg. She felt like it was a stress fracture but she had an x-ray yesterday that did not show anything. No increased skin irritation can be seen in this area. Patient was educated not to wear her CIrcAid and to use her compression stocking and compression bandages until her next visit to see if this increases her comfort. She was measured today for off the shelf flat knit compression thigh highs for daytime management of her bilateral LE lymphedema since the circular knit does not seem to be managing her edema as well as bandages. Pt's abdominal circumferential measurement is up since last session and she is going to disciss this with her doctor at [facility] today.    Rehab Potential Good   PT Frequency 2x / week   PT Duration 8 weeks   PT Treatment/Interventions Vasopneumatic Device;Manual lymph drainage;Compression bandaging;Therapeutic exercise   PT Next Visit Plan MLD to BLEs and compression garment to RLE, assess for decrease pain with use of stocking instead of velcro compression garment - pt to receive compression pump Monday - ask about this   PT Home Exercise Plan Self manual lymph drainage for Rt LE.   Consulted and Agree with Plan of Care Patient      Patient will benefit from skilled therapeutic  intervention in order to improve the following deficits and impairments:  Increased edema  Visit Diagnosis: Lymphedema, not elsewhere classified     Problem List Patient Active Problem List   Diagnosis Date Noted  .  POSTNASAL DRIP 08/23/2009  . ASTHMA 08/01/2009  . ARTHRITIS 08/01/2009  . SHORTNESS OF BREATH (SOB) 08/01/2009    Alexia Freestone 09/09/2015, 10:51 AM  [place], [place], [address] Phone: [phone]   Fax:  [phone]  Name: EMMALEAH MERONEY MRN: [id number] Date of Birth: 1951-01-26    Allyson Sabal, PT 09/09/2015 10:51 AM

## 2015-09-13 ENCOUNTER — Encounter: Payer: Self-pay | Admitting: Physical Therapy

## 2015-09-13 ENCOUNTER — Ambulatory Visit: Payer: BLUE CROSS/BLUE SHIELD | Admitting: Physical Therapy

## 2015-09-13 ENCOUNTER — Ambulatory Visit: Payer: BLUE CROSS/BLUE SHIELD | Attending: Family Medicine | Admitting: Physical Therapy

## 2015-09-13 DIAGNOSIS — I890 Lymphedema, not elsewhere classified: Secondary | ICD-10-CM | POA: Diagnosis present

## 2015-09-13 DIAGNOSIS — R29898 Other symptoms and signs involving the musculoskeletal system: Secondary | ICD-10-CM | POA: Diagnosis present

## 2015-09-13 NOTE — Therapy (Signed)
[place], [place], [address] Phone: [phone]   Fax:  [phone]  Physical Therapy Treatment  Patient Details  Name: Anna Kennedy MRN: [id number] Date of Birth: [birth year] Referring Provider: Darcus Austin  Encounter Date: 09/13/2015      PT End of Session - 09/13/15 0859    Visit Number 21   Number of Visits 40   Date for PT Re-Evaluation 10/08/15   Authorization Type April cert completed   PT Start Time 0805   PT Stop Time 0854   PT Time Calculation (min) 49 min   Activity Tolerance Patient tolerated treatment well   Behavior During Therapy [facility] for tasks assessed/performed      Past Medical History  Diagnosis Date  . Retinal tear     treated previously with laser surgery  . Atypical chest pain   . Other social stressor   . Osteoarthritis of both knees   . History of depression   . History of asthma   . Cancer ([place])     ovarian    Past Surgical History  Procedure Laterality Date  . Knee surgery      Of her knees  . Foot surgery      Right foot    There were no vitals filed for this visit.      Subjective Assessment - 09/13/15 0810    Subjective The pain in my leg got way better because I wore the compression stocking all weekend instead of the compression hose. My doctor appointment did not go very well. My tumor markers went up this time. I took CircAid off late yesterday afternoon and at first my calf was ok but my knee and thigh were ballooned up.    Pertinent History Was diagnosed Nov 25,2013 with ovarian cancer. Pt underwent a total hysterectomy in Dec 2013 at that time no lymph nodes were involved. Pt is recieving chemotherapy infusions but did not require radiation. Now one lymph node is involved in right groin and pt has edema of  bilateral LEs with right worse than left. They initially thought it was cellulitis and she was hospitilized but later pt learned it was  lymphedema. Pt has never had a DVT.   How long can you sit comfortably? approx 45 min to an hour, pt tries to get up every 20 minutes or so at work   Patient Stated Goals to get LE swelling under control and get some relief   Currently in Pain? No/denies                         [facility] Adult PT Treatment/Exercise - 09/13/15 0001    Manual Therapy   Manual Lymphatic Drainage (MLD) Manual lymph drainage to BLE in supine: short neck, superficial and deep abdominals bilateral axillae, bilateral inguino-axillary pathways; Left LE anterior lower abdomen, lateral hip and then medial to lateral hip redirecting along pathway; left lower leg and ankle/dorsal foot redirecting along lateral hip and then pathways; then to prone for posterior leg and posterior knee moving laterally and re directing towrads pathways repeated process with right LE; ended with bilateral axillae.    Compression Bandaging donned circaid to RLE making sure all straps werer at 30-40 mm Hg                   Short Term Clinic Goals - 09/09/15 1046    CC Short Term Goal  #1  Title Pt to demonstrate a 3 cm decrease of right thigh edema (20 cm above suprapatella) to decrease risk of cellulitis   Baseline 51.6 cm; 54.5 cm 07/20/15 but the bandage had slid beneath this point and pt had great reductions below this causing increase fluid above, 3//16: 51; 08/03/15 50.4 cm; 52.8 08/24/15 so resumed Rt LE bandaging today, 09/09/15-50.9   Time 4   Period Weeks   Status On-going   CC Short Term Goal  #2   Title Pt to demonstrate a 2 cm decrease of right calf edema (30cm proximal to floor) to decrease risk of cellulitis   Baseline 38 cm; 36.2 cm 07/20/15, 32 cm on 3//16/2017   Time 4   Period Weeks   Status Achieved             Long Term Clinic Goals - 09/09/15 1048    CC Long Term Goal  #1   Title Pt to demonstrate a 6 cm decrease of right thigh edema (20 cm above suprapatella) to decrease risk of cellulitis    Baseline 51.6 cm   Time 8   Period Weeks   Status Achieved   CC Long Term Goal  #2   Title Pt to demonstrate a 4 cm decrease of right calf edema (30cm proximal to floor) to decrease risk of cellulitis   Baseline 38, 32 cm on 07/28/2015   Time 8   Period Weeks   Status Achieved   CC Long Term Goal  #3   Title Pt to be receive with appropriate compression garments for day and night time use for long term management of edema   Baseline patient has received compression stockings for day time use, pt to be measured for night time garment, pt to receive trial of FlexiTouch compression pump, 09/09/15- pt to receive compression pump on Monday, she has a nighttime garment for RLE, circular knit compression thigh highs for BLE and was measured today for flat knit compression thigh highs for better containment   Time 8   Period Weeks   Status Partially Met   CC Long Term Goal  #4   Title Pt to verbalize signs/symptoms of infection in bilateral LEs and lymphedema precautions   Baseline none   Time 8   Period Weeks   Status Partially Met            Plan - 09/13/15 0901    Clinical Impression Statement Patient has been wearing the compression stockings over the weekend and her right ankle and knee pain was better with compression stockings. Spent time educating pt to only adjust velcro garments to 30-40 mmHg to see if they still caused discomfort. Pt has increased edema in her RLE today with increased fibrosis noted at lateral right thigh and over knee. Flat knit compression thigh highs have been ordered for bettter containment during the day.    Rehab Potential Good   Clinical Impairments Affecting Rehab Potential pt goes for chemotherapy infusions   PT Frequency 2x / week   PT Duration 8 weeks   PT Treatment/Interventions Vasopneumatic Device;Manual lymph drainage;Compression bandaging;Therapeutic exercise   PT Next Visit Plan measure and assess goals, check if pt has used compression pump,  assess for pain with CircAids continue MLD to BLEs   PT Home Exercise Plan Self manual lymph drainage for Rt LE.   Consulted and Agree with Plan of Care Patient      Patient will benefit from skilled therapeutic intervention in order to improve the following  deficits and impairments:  Increased edema  Visit Diagnosis: Lymphedema, not elsewhere classified     Problem List Patient Active Problem List   Diagnosis Date Noted  . POSTNASAL DRIP 08/23/2009  . ASTHMA 08/01/2009  . ARTHRITIS 08/01/2009  . SHORTNESS OF BREATH (SOB) 08/01/2009    Alexia Freestone 09/13/2015, 9:08 AM  [place], [place], [address] Phone: [phone]   Fax:  [phone]  Name: Anna Kennedy MRN: [id number] Date of Birth: [birth year]    Allyson Sabal, PT 09/13/2015 9:09 AM

## 2015-09-14 ENCOUNTER — Telehealth: Payer: Self-pay

## 2015-09-14 NOTE — Telephone Encounter (Signed)
Pt called to ask about using her compression pump which arrived by mail yesterday. She wanted to know if okay to use on only Rt LE each day for now as that is the most swollen LE at the moment and instructed pt absolutely okay to do same leg back to back each day. She also asked if okay to do abdominal piece 2x/day if she does end up doing one leg in morning and other leg in evening and for that instructed her to ask physician in regards to the pressure it will put on her abdominal ascites and with doing once/day for now to stop if she has any increased discomfort or pain and she agreed/verbalized understanding and will follow up with her oncologist prn. Her demonstration is set for Sunday though she said her and her husband watched the DVD and might try using the pump before then.

## 2015-09-15 ENCOUNTER — Ambulatory Visit: Payer: BLUE CROSS/BLUE SHIELD | Admitting: Physical Therapy

## 2015-09-15 DIAGNOSIS — I890 Lymphedema, not elsewhere classified: Secondary | ICD-10-CM

## 2015-09-15 DIAGNOSIS — R29898 Other symptoms and signs involving the musculoskeletal system: Secondary | ICD-10-CM

## 2015-09-16 NOTE — Therapy (Signed)
[place], [place], [address] Phone: [phone]   Fax:  [phone]  Physical Therapy Treatment  Patient Details  Name: Anna Kennedy MRN: [id number] Date of Birth: [birth year] Referring Provider: Darcus Austin  Encounter Date: 09/15/2015      PT End of Session - 09/16/15 0727    Visit Number 22   Number of Visits 40   Date for PT Re-Evaluation 10/08/15   PT Start Time 1302   PT Stop Time 1430   PT Time Calculation (min) 88 min   Activity Tolerance Patient tolerated treatment well   Behavior During Therapy [facility] for tasks assessed/performed      Past Medical History  Diagnosis Date  . Retinal tear     treated previously with laser surgery  . Atypical chest pain   . Other social stressor   . Osteoarthritis of both knees   . History of depression   . History of asthma   . Cancer ([place])     ovarian    Past Surgical History  Procedure Laterality Date  . Knee surgery      Of her knees  . Foot surgery      Right foot    There were no vitals filed for this visit.      Subjective Assessment - 09/15/15 1308    Subjective Pt comes in wearing her bilateral compression hose.  She has used the flexitouch twice abd will have official training on Sunday.  She did notice that right leg swelled up after being on it about  2 hours without compression stockings She wore circaids yesterday  but had to take them off because knee gets so swollen and ankle gets painful    Pertinent History Was diagnosed Nov 25,2013 with ovarian cancer. Pt underwent a total hysterectomy in Dec 2013 at that time no lymph nodes were involved. Pt is recieving chemotherapy infusions but did not require radiation. Now one lymph node is involved in right groin and pt has edema of  bilateral LEs with right worse than left. They initially thought it was cellulitis and she was hospitilized but later pt learned it was lymphedema. Pt has  never had a DVT.   Patient Stated Goals to get LE swelling under control and get some relief   Currently in Pain? No/denies  feels better with stockings on                         [facility] Adult PT Treatment/Exercise - 09/16/15 0001    Self-Care   Other Self-Care Comments  right hip and knee AROM in supine and sidelying    Manual Therapy   Manual Lymphatic Drainage (MLD) Manual lymph drainage to RLE in supine: short neck, superficial and deep abdominals bilateral axillae, bilateral inguino-axillary pathways;RLE anterior lower abdomen, lateral hip and then medial to lateral hip redirecting along pathway; Right lower leg and ankle/dorsal foot redirecting along lateral hip and then pathways; then to left sidelying  for posterior leg and posterior knee moving laterally and re directing towrads pathways; ended with right  axillae.    Compression Bandaging reapplied bilaeral compression stockings                 PT Education - 09/16/15 0726    Education provided Yes   Education Details try wearing circaids just at night after using Flexitouch   Person(s) Educated Patient   Methods Explanation   Comprehension Verbalized  understanding           Short Term Clinic Goals - 09/09/15 1046    CC Short Term Goal  #1   Title Pt to demonstrate a 3 cm decrease of right thigh edema (20 cm above suprapatella) to decrease risk of cellulitis   Baseline 51.6 cm; 54.5 cm 07/20/15 but the bandage had slid beneath this point and pt had great reductions below this causing increase fluid above, 3//16: 51; 08/03/15 50.4 cm; 52.8 08/24/15 so resumed Rt LE bandaging today, 09/09/15-50.9   Time 4   Period Weeks   Status On-going   CC Short Term Goal  #2   Title Pt to demonstrate a 2 cm decrease of right calf edema (30cm proximal to floor) to decrease risk of cellulitis   Baseline 38 cm; 36.2 cm 07/20/15, 32 cm on 3//16/2017   Time 4   Period Weeks   Status Achieved             Long  Term Clinic Goals - 09/09/15 1048    CC Long Term Goal  #1   Title Pt to demonstrate a 6 cm decrease of right thigh edema (20 cm above suprapatella) to decrease risk of cellulitis   Baseline 51.6 cm   Time 8   Period Weeks   Status Achieved   CC Long Term Goal  #2   Title Pt to demonstrate a 4 cm decrease of right calf edema (30cm proximal to floor) to decrease risk of cellulitis   Baseline 38, 32 cm on 07/28/2015   Time 8   Period Weeks   Status Achieved   CC Long Term Goal  #3   Title Pt to be receive with appropriate compression garments for day and night time use for long term management of edema   Baseline patient has received compression stockings for day time use, pt to be measured for night time garment, pt to receive trial of FlexiTouch compression pump, 09/09/15- pt to receive compression pump on Monday, she has a nighttime garment for RLE, circular knit compression thigh highs for BLE and was measured today for flat knit compression thigh highs for better containment   Time 8   Period Weeks   Status Partially Met   CC Long Term Goal  #4   Title Pt to verbalize signs/symptoms of infection in bilateral LEs and lymphedema precautions   Baseline none   Time 8   Period Weeks   Status Partially Met            Plan - 09/16/15 1478    Clinical Impression Statement Pt is having pain in right posterior knee after wearing Circaids and right leg is much fuller with lymphedema than left leg today.  All session was spent on manual lymph drainage of right leg especailly around knee area and patient felt symptomatic relief.  Encouraged her to continue exercise in the water with leg range of motion in addition to  walking.  Noticed that knee piece of circaid as opeining in back of knee.  Will call [facility] to see if they have oher suggestion .    Clinical Impairments Affecting Rehab Potential pt goes for chemotherapy infusions   PT Next Visit Plan measure and assess goals, assess for  difference in pain with different CircAid routhine.  check with [facility] for other options  continue MLD to BLEs as indicated    Consulted and Agree with Plan of Care Patient      Patient will benefit  from skilled therapeutic intervention in order to improve the following deficits and impairments:     Visit Diagnosis: Lymphedema, not elsewhere classified  Other symptoms and signs involving the musculoskeletal system     Problem List Patient Active Problem List   Diagnosis Date Noted  . POSTNASAL DRIP 08/23/2009  . ASTHMA 08/01/2009  . ARTHRITIS 08/01/2009  . SHORTNESS OF BREATH (SOB) 08/01/2009   Donato Heinz. Owens Shark, PT   09/16/2015, 7:34 AM  [place], [place], [address] Phone: [phone]   Fax:  [phone]  Name: Anna Kennedy MRN: [id number] Date of Birth: 09/16/1950

## 2015-09-19 ENCOUNTER — Encounter: Payer: Self-pay | Admitting: Physical Therapy

## 2015-09-19 ENCOUNTER — Ambulatory Visit: Payer: BLUE CROSS/BLUE SHIELD | Admitting: Physical Therapy

## 2015-09-19 DIAGNOSIS — I890 Lymphedema, not elsewhere classified: Secondary | ICD-10-CM

## 2015-09-19 NOTE — Therapy (Signed)
[place], [place], [address] Phone: [phone]   Fax:  [phone]  Physical Therapy Treatment  Patient Details  Name: Anna Kennedy MRN: [id number] Date of Birth: 1950/08/23 Referring Provider: Darcus Austin  Encounter Date: 09/19/2015      PT End of Session - 09/19/15 1710    Visit Number 23   Number of Visits 40   Date for PT Re-Evaluation 10/08/15   Authorization Type April cert completed   PT Start Time 1350   PT Stop Time 1516   PT Time Calculation (min) 86 min   Activity Tolerance Patient tolerated treatment well   Behavior During Therapy [facility] for tasks assessed/performed      Past Medical History  Diagnosis Date  . Retinal tear     treated previously with laser surgery  . Atypical chest pain   . Other social stressor   . Osteoarthritis of both knees   . History of depression   . History of asthma   . Cancer ([place])     ovarian    Past Surgical History  Procedure Laterality Date  . Knee surgery      Of her knees  . Foot surgery      Right foot    There were no vitals filed for this visit.      Subjective Assessment - 09/19/15 1352    Subjective The trainer came yesterday and helped me with my pump. I put my compression hose on right after I was done. My abdomen has gotten really swollen though. I put the CircAid on last night and it was not as bad behind my knee. My ankle was hurting really bad so I took off the foot piece.    Pertinent History Was diagnosed Nov 25,2013 with ovarian cancer. Pt underwent a total hysterectomy in Dec 2013 at that time no lymph nodes were involved. Pt is recieving chemotherapy infusions but did not require radiation. Now one lymph node is involved in right groin and pt has edema of  bilateral LEs with right worse than left. They initially thought it was cellulitis and she was hospitilized but later pt learned it was lymphedema. Pt has never had a DVT.    Patient Stated Goals to get LE swelling under control and get some relief   Currently in Pain? No/denies               LYMPHEDEMA/ONCOLOGY QUESTIONNAIRE - 09/19/15 1358    Right Lower Extremity Lymphedema   30 cm Proximal to Suprapatella (p) 56.8 cm   20 cm Proximal to Suprapatella (p) 50.3 cm   10 cm Proximal to Suprapatella (p) 45.6 cm   At Midpatella/Popliteal Crease (p) 40.5 cm   30 cm Proximal to Floor at Lateral Plantar Foot (p) 38 cm   20 cm Proximal to Floor at Lateral Plantar Foot (p) 29 1   10  cm Proximal to Floor at Lateral Malleoli (p) 23.7 cm   5 cm Proximal to 1st MTP Joint (p) 21.9 cm   Around Proximal Great Toe (p) 7.5 cm                  [facility] Adult PT Treatment/Exercise - 09/19/15 0001    Manual Therapy   Manual Lymphatic Drainage (MLD) Manual lymph drainage to RLE in supine: short neck, superficial and deep abdominals right axilla, right inguino-axillary pathways; Right LE anterior lower abdomen, lateral hip and then medial to lateral hip redirecting along pathway;  right lower leg and ankle/dorsal foot redirecting along lateral hip and then pathways; then to prone for posterior leg and posterior knee moving laterally and re directing towrads pathways    Compression Bandaging pt reapplied bilateral compression stockings                   Short Term Clinic Goals - 09/19/15 1714    CC Short Term Goal  #1   Title Pt to demonstrate a 3 cm decrease of right thigh edema (20 cm above suprapatella) to decrease risk of cellulitis   Baseline 51.6 cm; 54.5 cm 07/20/15 but the bandage had slid beneath this point and pt had great reductions below this causing increase fluid above, 3//16: 51; 08/03/15 50.4 cm; 52.8 08/24/15 so resumed Rt LE bandaging today, 09/09/15-50.9, 09/19/15- 50.3   Time 4   Period Weeks   Status On-going   CC Short Term Goal  #2   Title Pt to demonstrate a 2 cm decrease of right calf edema (30cm proximal to floor) to decrease risk  of cellulitis   Baseline 38 cm; 36.2 cm 07/20/15, 32 cm on 3//16/2017,    Time 4   Period Weeks   Status Achieved             Long Term Clinic Goals - 09/19/15 1510    CC Long Term Goal  #1   Title Pt to demonstrate a 6 cm decrease of right thigh edema (20 cm above suprapatella) to decrease risk of cellulitis   Baseline 51.6 cm,   Time 8   Period Weeks   Status Achieved   CC Long Term Goal  #2   Title Pt to demonstrate a 4 cm decrease of right calf edema (30cm proximal to floor) to decrease risk of cellulitis   Baseline 38, 32 cm on 07/28/2015   Status Achieved   CC Long Term Goal  #3   Title Pt to be receive with appropriate compression garments for day and night time use for long term management of edema   Baseline patient has received compression stockings for day time use, pt to be measured for night time garment, pt to receive trial of FlexiTouch compression pump, 09/09/15- pt to receive compression pump on Monday, she has a nighttime garment for RLE, circular knit compression thigh highs for BLE and was measured today for flat knit compression thigh highs for better containment, 09/19/15- pt has compression pump and is awaiting arrival of flat knit thigh highs   Time 8   Period Weeks   Status Partially Met   CC Long Term Goal  #4   Title Pt to verbalize signs/symptoms of infection in bilateral LEs and lymphedema precautions   Baseline pt able to do both of these   Status Achieved            Plan - 09/19/15 1710    Clinical Impression Statement Goals assessed and circumferential measurements taken. Pt having pain at ankle with use of CircAid. To cut grey foam to pad this area at next session. She is demonstrating fibrosis from the right knee to foot with minimal change following MLD. She has been sitting in a chair for hours today at work with her leg in a dependent postion and has been wearing her circular knit garments. Her flat knit garments should be arriving soon and  they have better containment than the circular knit which may control the swelling better. Pt encouraged to don her compression stocking as soon as  possible after she is done with water aerobics to reduce edema. LLE edema seems well controlled. Pt has been using compression p ump on RLE daily but recently has also had an increase in abdominal swelling.    Rehab Potential Good   Clinical Impairments Affecting Rehab Potential pt goes for chemotherapy infusions   PT Frequency 2x / week   PT Duration 8 weeks   PT Treatment/Interventions Vasopneumatic Device;Manual lymph drainage;Compression bandaging;Therapeutic exercise   PT Next Visit Plan add grey foam around ankle for pt to wear under circaids and behind knee, MLD to RLE only since this is the worst   PT Home Exercise Plan use compression pump daily on RLE   Consulted and Agree with Plan of Care Patient      Patient will benefit from skilled therapeutic intervention in order to improve the following deficits and impairments:  Increased edema  Visit Diagnosis: Lymphedema, not elsewhere classified     Problem List Patient Active Problem List   Diagnosis Date Noted  . POSTNASAL DRIP 08/23/2009  . ASTHMA 08/01/2009  . ARTHRITIS 08/01/2009  . SHORTNESS OF BREATH (SOB) 08/01/2009    Alexia Freestone 09/19/2015, 5:21 PM  [place], [place], [address] Phone: [phone]   Fax:  [phone]  Name: Anna Kennedy MRN: [id number] Date of Birth: January 25, 1951    Allyson Sabal, PT 09/19/2015 5:21 PM

## 2015-09-22 ENCOUNTER — Ambulatory Visit: Payer: BLUE CROSS/BLUE SHIELD | Admitting: Physical Therapy

## 2015-09-22 ENCOUNTER — Encounter: Payer: Self-pay | Admitting: Physical Therapy

## 2015-09-22 DIAGNOSIS — I890 Lymphedema, not elsewhere classified: Secondary | ICD-10-CM

## 2015-09-22 NOTE — Therapy (Signed)
[place], [place], [address] Phone: [phone]   Fax:  [phone]  Physical Therapy Treatment  Patient Details  Name: Anna Kennedy MRN: [id number] Date of Birth: [birth year] Referring Provider: Darcus Austin  Encounter Date: 09/22/2015      PT End of Session - 09/22/15 1654    Visit Number 24   Number of Visits 40   Authorization Type April cert completed   PT Start Time 1520   PT Stop Time 1645   PT Time Calculation (min) 85 min   Activity Tolerance Patient tolerated treatment well   Behavior During Therapy [facility] for tasks assessed/performed      Past Medical History  Diagnosis Date  . Retinal tear     treated previously with laser surgery  . Atypical chest pain   . Other social stressor   . Osteoarthritis of both knees   . History of depression   . History of asthma   . Cancer ([place])     ovarian    Past Surgical History  Procedure Laterality Date  . Knee surgery      Of her knees  . Foot surgery      Right foot    There were no vitals filed for this visit.      Subjective Assessment - 09/22/15 1522    Subjective I got my flat knit garments. The abdomen swelling is no better. I have been religiously using my compression pump. I have been wrapping my foot instead of using the foot piece over night and I think it is a little bit better.    Pertinent History Was diagnosed Nov 25,2013 with ovarian cancer. Pt underwent a total hysterectomy in Dec 2013 at that time no lymph nodes were involved. Pt is recieving chemotherapy infusions but did not require radiation. Now one lymph node is involved in right groin and pt has edema of  bilateral LEs with right worse than left. They initially thought it was cellulitis and she was hospitilized but later pt learned it was lymphedema. Pt has never had a DVT.   How long can you sit comfortably? approx 45 min to an hour, pt tries to get up every 20  minutes or so at work   Patient Stated Goals to get LE swelling under control and get some relief   Currently in Pain? No/denies  coming in the car I had pain behind my knee and in my back. I think the stockings got bunched up behind my knee.                          [facility] Adult PT Treatment/Exercise - 09/22/15 0001    Manual Therapy   Manual Lymphatic Drainage (MLD) Manual lymph drainage to RLE in supine: short neck, superficial and deep abdominals right axilla, right inguino-axillary pathways; Right LE anterior lower abdomen, lateral hip and then medial to lateral hip redirecting along pathway; right lower leg and ankle/dorsal foot redirecting along lateral hip and then pathways; then to prone for posterior leg and posterior knee moving laterally and re directing towrads pathways    Compression Bandaging pt reapplied bilateral flat knit compression stockings                   Short Term Clinic Goals - 09/19/15 1714    CC Short Term Goal  #1   Title Pt to demonstrate a 3 cm decrease of right thigh  edema (20 cm above suprapatella) to decrease risk of cellulitis   Baseline 51.6 cm; 54.5 cm 07/20/15 but the bandage had slid beneath this point and pt had great reductions below this causing increase fluid above, 3//16: 51; 08/03/15 50.4 cm; 52.8 08/24/15 so resumed Rt LE bandaging today, 09/09/15-50.9, 09/19/15- 50.3   Time 4   Period Weeks   Status On-going   CC Short Term Goal  #2   Title Pt to demonstrate a 2 cm decrease of right calf edema (30cm proximal to floor) to decrease risk of cellulitis   Baseline 38 cm; 36.2 cm 07/20/15, 32 cm on 3//16/2017,    Time 4   Period Weeks   Status Achieved             Long Term Clinic Goals - 09/19/15 1510    CC Long Term Goal  #1   Title Pt to demonstrate a 6 cm decrease of right thigh edema (20 cm above suprapatella) to decrease risk of cellulitis   Baseline 51.6 cm,   Time 8   Period Weeks   Status Achieved   CC Long  Term Goal  #2   Title Pt to demonstrate a 4 cm decrease of right calf edema (30cm proximal to floor) to decrease risk of cellulitis   Baseline 38, 32 cm on 07/28/2015   Status Achieved   CC Long Term Goal  #3   Title Pt to be receive with appropriate compression garments for day and night time use for long term management of edema   Baseline patient has received compression stockings for day time use, pt to be measured for night time garment, pt to receive trial of FlexiTouch compression pump, 09/09/15- pt to receive compression pump on Monday, she has a nighttime garment for RLE, circular knit compression thigh highs for BLE and was measured today for flat knit compression thigh highs for better containment, 09/19/15- pt has compression pump and is awaiting arrival of flat knit thigh highs   Time 8   Period Weeks   Status Partially Met   CC Long Term Goal  #4   Title Pt to verbalize signs/symptoms of infection in bilateral LEs and lymphedema precautions   Baseline pt able to do both of these   Status Achieved            Plan - 09/22/15 1655    Clinical Impression Statement Pt received flat knit compression garments and has been wearing them all day. When she removed her garments her right leg appeared smaller than last session. It appears that the flat knit controls her edema better than the circular knit garment. Two pieces of foam were cut for pt to use behind knee and around ankle when she is wearing velcro compression garments. Pt also demonstrated softening of edema in RLE following MLD.    Rehab Potential Good   Clinical Impairments Affecting Rehab Potential pt goes for chemotherapy infusions   PT Frequency 2x / week   PT Duration 8 weeks   PT Treatment/Interventions Vasopneumatic Device;Manual lymph drainage;Compression bandaging;Therapeutic exercise   PT Next Visit Plan assess how foam worked, MLD to RLE only   PT Home Exercise Plan use compression pump daily on RLE      Patient  will benefit from skilled therapeutic intervention in order to improve the following deficits and impairments:  Increased edema  Visit Diagnosis: Lymphedema, not elsewhere classified     Problem List Patient Active Problem List   Diagnosis Date Noted  .  POSTNASAL DRIP 08/23/2009  . ASTHMA 08/01/2009  . ARTHRITIS 08/01/2009  . SHORTNESS OF BREATH (SOB) 08/01/2009    Anna Kennedy 09/22/2015, 4:58 PM  [place], [place], [address] Phone: [phone]   Fax:  [phone]  Name: Anna Kennedy MRN: [id number] Date of Birth: 18-Nov-1950    Allyson Sabal, PT 09/22/2015 4:58 PM

## 2015-09-27 ENCOUNTER — Ambulatory Visit: Payer: BLUE CROSS/BLUE SHIELD | Admitting: Physical Therapy

## 2015-09-27 ENCOUNTER — Encounter: Payer: Self-pay | Admitting: Physical Therapy

## 2015-09-27 DIAGNOSIS — I890 Lymphedema, not elsewhere classified: Secondary | ICD-10-CM

## 2015-09-27 NOTE — Therapy (Signed)
[place], [place], [address] Phone: [phone]   Fax:  [phone]  Physical Therapy Treatment  Patient Details  Name: Anna Kennedy MRN: [id number] Date of Birth: 06-04-1950 Referring Provider: Darcus Austin  Encounter Date: 09/27/2015      PT End of Session - 09/27/15 1644    Visit Number 25   Number of Visits 40   Date for PT Re-Evaluation 10/08/15   Authorization Type April cert completed   PT Start Time 1608   PT Stop Time 1648   PT Time Calculation (min) 40 min   Activity Tolerance Patient tolerated treatment well   Behavior During Therapy [facility] for tasks assessed/performed      Past Medical History  Diagnosis Date  . Retinal tear     treated previously with laser surgery  . Atypical chest pain   . Other social stressor   . Osteoarthritis of both knees   . History of depression   . History of asthma   . Cancer ([place])     ovarian    Past Surgical History  Procedure Laterality Date  . Knee surgery      Of her knees  . Foot surgery      Right foot    There were no vitals filed for this visit.      Subjective Assessment - 09/27/15 1608    Subjective I started my chemo drug last Friday and I have been feeling kind of bad. I feel nauseated. This flat knit stocking is helping my swelling. I used the glue but it still kind of rides down.    Pertinent History Was diagnosed Nov 25,2013 with ovarian cancer. Pt underwent a total hysterectomy in Dec 2013 at that time no lymph nodes were involved. Pt is recieving chemotherapy infusions but did not require radiation. Now one lymph node is involved in right groin and pt has edema of  bilateral LEs with right worse than left. They initially thought it was cellulitis and she was hospitilized but later pt learned it was lymphedema. Pt has never had a DVT.   How long can you sit comfortably? approx 45 min to an hour, pt tries to get up every 20  minutes or so at work   Patient Stated Goals to get LE swelling under control and get some relief   Currently in Pain? No/denies                         [facility] Adult PT Treatment/Exercise - 09/27/15 0001    Manual Therapy   Manual Lymphatic Drainage (MLD) Manual lymph drainage to RLE in supine: short neck, superficial and deep abdominals right axilla, right inguino-axillary pathways; Right LE anterior lower abdomen, lateral hip and then medial to lateral hip redirecting along pathway; right lower leg and ankle/dorsal foot redirecting along lateral hip and then pathways; then to prone for posterior leg and posterior knee moving laterally and re directing towrads pathways    Compression Bandaging pt reapplied bilateral flat knit compression stockings                   Short Term Clinic Goals - 09/19/15 1714    CC Short Term Goal  #1   Title Pt to demonstrate a 3 cm decrease of right thigh edema (20 cm above suprapatella) to decrease risk of cellulitis   Baseline 51.6 cm; 54.5 cm 07/20/15 but the bandage had slid beneath this  point and pt had great reductions below this causing increase fluid above, 3//16: 51; 08/03/15 50.4 cm; 52.8 08/24/15 so resumed Rt LE bandaging today, 09/09/15-50.9, 09/19/15- 50.3   Time 4   Period Weeks   Status On-going   CC Short Term Goal  #2   Title Pt to demonstrate a 2 cm decrease of right calf edema (30cm proximal to floor) to decrease risk of cellulitis   Baseline 38 cm; 36.2 cm 07/20/15, 32 cm on 3//16/2017,    Time 4   Period Weeks   Status Achieved             Long Term Clinic Goals - 09/19/15 1510    CC Long Term Goal  #1   Title Pt to demonstrate a 6 cm decrease of right thigh edema (20 cm above suprapatella) to decrease risk of cellulitis   Baseline 51.6 cm,   Time 8   Period Weeks   Status Achieved   CC Long Term Goal  #2   Title Pt to demonstrate a 4 cm decrease of right calf edema (30cm proximal to floor) to decrease  risk of cellulitis   Baseline 38, 32 cm on 07/28/2015   Status Achieved   CC Long Term Goal  #3   Title Pt to be receive with appropriate compression garments for day and night time use for long term management of edema   Baseline patient has received compression stockings for day time use, pt to be measured for night time garment, pt to receive trial of FlexiTouch compression pump, 09/09/15- pt to receive compression pump on Monday, she has a nighttime garment for RLE, circular knit compression thigh highs for BLE and was measured today for flat knit compression thigh highs for better containment, 09/19/15- pt has compression pump and is awaiting arrival of flat knit thigh highs   Time 8   Period Weeks   Status Partially Met   CC Long Term Goal  #4   Title Pt to verbalize signs/symptoms of infection in bilateral LEs and lymphedema precautions   Baseline pt able to do both of these   Status Achieved            Plan - 09/27/15 1645    Clinical Impression Statement Pt's edema in her RLE is softer today. There is less fibrosis at lower leg. Her abdomen feels softer though it does feel more full. Her flat knit stockings seem to be controlling her swelling and she wore the foam in her velcro compression garments and states that helped.   Rehab Potential Good   Clinical Impairments Affecting Rehab Potential pt goes for chemotherapy infusions   PT Frequency 2x / week   PT Duration 8 weeks   PT Treatment/Interventions Vasopneumatic Device;Manual lymph drainage;Compression bandaging;Therapeutic exercise   PT Next Visit Plan MLD to RLE only   PT Home Exercise Plan assess goals, use compression pump daily on RLE   Consulted and Agree with Plan of Care Patient      Patient will benefit from skilled therapeutic intervention in order to improve the following deficits and impairments:  Increased edema  Visit Diagnosis: Lymphedema, not elsewhere classified     Problem List Patient Active  Problem List   Diagnosis Date Noted  . POSTNASAL DRIP 08/23/2009  . ASTHMA 08/01/2009  . ARTHRITIS 08/01/2009  . SHORTNESS OF BREATH (SOB) 08/01/2009    Alexia Freestone 09/27/2015, 4:51 PM  [place], [place], [address]  Phone: [phone]   Fax:  [phone]  Name: Anna Kennedy MRN: [id number] Date of Birth: 1950/11/27    Allyson Sabal, PT 09/27/2015 4:51 PM

## 2015-09-29 ENCOUNTER — Encounter: Payer: Self-pay | Admitting: Physical Therapy

## 2015-09-29 ENCOUNTER — Ambulatory Visit: Payer: BLUE CROSS/BLUE SHIELD | Admitting: Physical Therapy

## 2015-09-29 DIAGNOSIS — I890 Lymphedema, not elsewhere classified: Secondary | ICD-10-CM | POA: Diagnosis not present

## 2015-09-29 NOTE — Therapy (Signed)
[place], [place], [address] Phone: [phone]   Fax:  [phone]  Physical Therapy Treatment  Patient Details  Name: Anna Kennedy MRN: [id number] Date of Birth: 1950/10/08 Referring Provider: Darcus Austin  Encounter Date: 09/29/2015      PT End of Session - 09/29/15 1657    Visit Number 26   Number of Visits 40   Date for PT Re-Evaluation 10/08/15   Authorization Type April cert completed   PT Start Time 1603   PT Stop Time 1649   PT Time Calculation (min) 46 min   Activity Tolerance Patient tolerated treatment well   Behavior During Therapy [facility] for tasks assessed/performed      Past Medical History  Diagnosis Date  . Retinal tear     treated previously with laser surgery  . Atypical chest pain   . Other social stressor   . Osteoarthritis of both knees   . History of depression   . History of asthma   . Cancer ([place])     ovarian    Past Surgical History  Procedure Laterality Date  . Knee surgery      Of her knees  . Foot surgery      Right foot    There were no vitals filed for this visit.      Subjective Assessment - 09/29/15 1605    Subjective I was so uncomfortable yesterday. I did not get time to do the pump yesterday. Today I feel my stomach swelling has gone down a little.    Pertinent History Was diagnosed Nov 25,2013 with ovarian cancer. Pt underwent a total hysterectomy in Dec 2013 at that time no lymph nodes were involved. Pt is recieving chemotherapy infusions but did not require radiation. Now one lymph node is involved in right groin and pt has edema of  bilateral LEs with right worse than left. They initially thought it was cellulitis and she was hospitilized but later pt learned it was lymphedema. Pt has never had a DVT.   Patient Stated Goals to get LE swelling under control and get some relief   Currently in Pain? No/denies                LYMPHEDEMA/ONCOLOGY QUESTIONNAIRE - 09/29/15 1608    Right Lower Extremity Lymphedema   30 cm Proximal to Suprapatella 61.6 cm   20 cm Proximal to Suprapatella 52.6 cm   10 cm Proximal to Suprapatella 47 cm   At Midpatella/Popliteal Crease 42 cm   30 cm Proximal to Floor at Lateral Plantar Foot 37.4 cm   20 cm Proximal to Floor at Lateral Plantar Foot 30.2 1   10  cm Proximal to Floor at Lateral Malleoli 22.8 cm   5 cm Proximal to 1st MTP Joint 20.5 cm   Around Proximal Great Toe 7.5 cm                  [facility] Adult PT Treatment/Exercise - 09/29/15 0001    Manual Therapy   Manual Lymphatic Drainage (MLD) Manual lymph drainage to RLE in supine: short neck, superficial and deep abdominals right axilla, right inguino-axillary pathways; Right LE anterior lower abdomen, lateral hip and then medial to lateral hip redirecting along pathway; right lower leg and ankle/dorsal foot redirecting along lateral hip and then pathways; then to prone for posterior leg and posterior knee moving laterally and re directing towrads pathways    Compression Bandaging pt reapplied bilateral flat knit compression  stockings                   Short Term Clinic Goals - 09/29/15 1646    CC Short Term Goal  #1   Title Pt to demonstrate a 3 cm decrease of right thigh edema (20 cm above suprapatella) to decrease risk of cellulitis   Baseline 51.6 cm; 54.5 cm 07/20/15 but the bandage had slid beneath this point and pt had great reductions below this causing increase fluid above, 3//16: 51; 08/03/15 50.4 cm; 52.8 08/24/15 so resumed Rt LE bandaging today, 09/09/15-50.9, 09/19/15- 50.3, 09/29/15- 52.6 cm    Time 4   Period Weeks   Status On-going   CC Short Term Goal  #2   Title Pt to demonstrate a 2 cm decrease of right calf edema (30cm proximal to floor) to decrease risk of cellulitis   Baseline 38 cm; 36.2 cm 07/20/15, 32 cm on 3//16/2017,    Status Achieved             Long Term Clinic Goals -  09/29/15 1648    CC Long Term Goal  #1   Title Pt to demonstrate a 6 cm decrease of right thigh edema (20 cm above suprapatella) to decrease risk of cellulitis   Status Achieved   CC Long Term Goal  #2   Title Pt to demonstrate a 4 cm decrease of right calf edema (30cm proximal to floor) to decrease risk of cellulitis   Status Achieved   CC Long Term Goal  #3   Title Pt to be receive with appropriate compression garments for day and night time use for long term management of edema   Baseline patient has received compression stockings for day time use, pt to be measured for night time garment, pt to receive trial of FlexiTouch compression pump, 09/09/15- pt to receive compression pump on Monday, she has a nighttime garment for RLE, circular knit compression thigh highs for BLE and was measured today for flat knit compression thigh highs for better containment, 09/19/15- pt has compression pump and is awaiting arrival of flat knit thigh highs, 09/29/15- Pt has received flat knit thigh high compression stockings and they are managing edema better than circular knit   Status Achieved   CC Long Term Goal  #4   Title Pt to verbalize signs/symptoms of infection in bilateral LEs and lymphedema precautions   Status Achieved            Plan - 09/29/15 1650    Clinical Impression Statement Pt had to work long hours yesterday and today. When she got home last night she did not use her compression pump and she also did not sleep with her CircAid velcro compression garment. She reports her right leg is more swolen today and increased fibrosis is noted at right calf. Pt is going to sleep in her night time garment the next few nights and resume daily pump use. She has been compliant with wearing her flat knit compression stockings. Her circumferential measurements are increased today but last visit they were down per visual estimate and as evidenced by increased skin mobility of right lower leg.    Rehab  Potential Good   Clinical Impairments Affecting Rehab Potential pt goes for chemotherapy infusions   PT Frequency 2x / week   PT Duration 8 weeks   PT Treatment/Interventions Vasopneumatic Device;Manual lymph drainage;Compression bandaging;Therapeutic exercise   PT Next Visit Plan MLD to RLE only, measure again   PT Home  Exercise Plan use compression pump daily on RLE   Consulted and Agree with Plan of Care Patient      Patient will benefit from skilled therapeutic intervention in order to improve the following deficits and impairments:  Increased edema  Visit Diagnosis: Lymphedema, not elsewhere classified     Problem List Patient Active Problem List   Diagnosis Date Noted  . POSTNASAL DRIP 08/23/2009  . ASTHMA 08/01/2009  . ARTHRITIS 08/01/2009  . SHORTNESS OF BREATH (SOB) 08/01/2009    Anna Kennedy 09/29/2015, 4:57 PM  [place], [place], [address] Phone: [phone]   Fax:  [phone]  Name: Anna Kennedy MRN: [id number] Date of Birth: 1951/03/31    Allyson Sabal, PT 09/29/2015 4:57 PM

## 2015-10-04 ENCOUNTER — Ambulatory Visit: Payer: BLUE CROSS/BLUE SHIELD | Admitting: Physical Therapy

## 2015-10-04 DIAGNOSIS — I890 Lymphedema, not elsewhere classified: Secondary | ICD-10-CM

## 2015-10-04 NOTE — Therapy (Signed)
[place], [place], [address] Phone: [phone]   Fax:  [phone]  Physical Therapy Treatment  Patient Details  Name: Anna Kennedy MRN: [id number] Date of Birth: [birth year] Referring Provider: Darcus Austin  Encounter Date: 10/04/2015      PT End of Session - 10/04/15 1656    Visit Number 27   Number of Visits 40   Date for PT Re-Evaluation 10/08/15   Authorization Type April cert completed   PT Start Time 1350   PT Stop Time 1431   PT Time Calculation (min) 41 min   Activity Tolerance Patient tolerated treatment well   Behavior During Therapy [facility] for tasks assessed/performed      Past Medical History  Diagnosis Date  . Retinal tear     treated previously with laser surgery  . Atypical chest pain   . Other social stressor   . Osteoarthritis of both knees   . History of depression   . History of asthma   . Cancer ([place])     ovarian    Past Surgical History  Procedure Laterality Date  . Knee surgery      Of her knees  . Foot surgery      Right foot    There were no vitals filed for this visit.      Subjective Assessment - 10/04/15 1353    Subjective I have been wearing my CircAid at night. It seems to be doing better. The flat knit keeps falling down.    Pertinent History Was diagnosed Nov 25,2013 with ovarian cancer. Pt underwent a total hysterectomy in Dec 2013 at that time no lymph nodes were involved. Pt is recieving chemotherapy infusions but did not require radiation. Now one lymph node is involved in right groin and pt has edema of  bilateral LEs with right worse than left. They initially thought it was cellulitis and she was hospitilized but later pt learned it was lymphedema. Pt has never had a DVT.   How long can you sit comfortably? approx 45 min to an hour, pt tries to get up every 20 minutes or so at work   Patient Stated Goals to get LE swelling under control and get  some relief   Currently in Pain? No/denies               LYMPHEDEMA/ONCOLOGY QUESTIONNAIRE - 10/04/15 1354    Right Lower Extremity Lymphedema   30 cm Proximal to Suprapatella 58.5 cm   20 cm Proximal to Suprapatella 50.5 cm   10 cm Proximal to Suprapatella 47.6 cm   At Midpatella/Popliteal Crease 41.2 cm   30 cm Proximal to Floor at Lateral Plantar Foot 36.6 cm   20 cm Proximal to Floor at Lateral Plantar Foot 28.3 1   10  cm Proximal to Floor at Lateral Malleoli 22.3 cm   5 cm Proximal to 1st MTP Joint 21.8 cm                  [facility] Adult PT Treatment/Exercise - 10/04/15 0001    Manual Therapy   Manual Lymphatic Drainage (MLD) Manual lymph drainage to RLE in supine: short neck, superficial and deep abdominals right axilla, right inguino-axillary pathways; Right LE anterior lower abdomen, lateral hip and then medial to lateral hip redirecting along pathway; right lower leg and ankle/dorsal foot redirecting along lateral hip and then pathways   Compression Bandaging pt reapplied bilateral flat knit compression stockings  Short Term Clinic Goals - 09/29/15 1646    CC Short Term Goal  #1   Title Pt to demonstrate a 3 cm decrease of right thigh edema (20 cm above suprapatella) to decrease risk of cellulitis   Baseline 51.6 cm; 54.5 cm 07/20/15 but the bandage had slid beneath this point and pt had great reductions below this causing increase fluid above, 3//16: 51; 08/03/15 50.4 cm; 52.8 08/24/15 so resumed Rt LE bandaging today, 09/09/15-50.9, 09/19/15- 50.3, 09/29/15- 52.6 cm    Time 4   Period Weeks   Status On-going   CC Short Term Goal  #2   Title Pt to demonstrate a 2 cm decrease of right calf edema (30cm proximal to floor) to decrease risk of cellulitis   Baseline 38 cm; 36.2 cm 07/20/15, 32 cm on 3//16/2017,    Status Achieved             Long Term Clinic Goals - 09/29/15 1648    CC Long Term Goal  #1   Title Pt to demonstrate a 6 cm  decrease of right thigh edema (20 cm above suprapatella) to decrease risk of cellulitis   Status Achieved   CC Long Term Goal  #2   Title Pt to demonstrate a 4 cm decrease of right calf edema (30cm proximal to floor) to decrease risk of cellulitis   Status Achieved   CC Long Term Goal  #3   Title Pt to be receive with appropriate compression garments for day and night time use for long term management of edema   Baseline patient has received compression stockings for day time use, pt to be measured for night time garment, pt to receive trial of FlexiTouch compression pump, 09/09/15- pt to receive compression pump on Monday, she has a nighttime garment for RLE, circular knit compression thigh highs for BLE and was measured today for flat knit compression thigh highs for better containment, 09/19/15- pt has compression pump and is awaiting arrival of flat knit thigh highs, 09/29/15- Pt has received flat knit thigh high compression stockings and they are managing edema better than circular knit   Status Achieved   CC Long Term Goal  #4   Title Pt to verbalize signs/symptoms of infection in bilateral LEs and lymphedema precautions   Status Achieved            Plan - 10/04/15 1657    Clinical Impression Statement Pt demonstrates some reduction in her RLE circumferential measurements today compared to last session. She has been wearing her velcro compression garments mostly since last session because her flat knits do not stay up due to long legs. Pt's posterior right knee pain presents similar to a baker's cyst and her chiropractor feels this may be what it is.    Rehab Potential Good   Clinical Impairments Affecting Rehab Potential pt goes for chemotherapy infusions   PT Frequency 2x / week   PT Duration 8 weeks   PT Treatment/Interventions Vasopneumatic Device;Manual lymph drainage;Compression bandaging;Therapeutic exercise   PT Next Visit Plan MLD to RLE only   PT Home Exercise Plan use  compression pump daily on RLE   Consulted and Agree with Plan of Care Patient      Patient will benefit from skilled therapeutic intervention in order to improve the following deficits and impairments:  Increased edema  Visit Diagnosis: Lymphedema, not elsewhere classified     Problem List Patient Active Problem List   Diagnosis Date Noted  . POSTNASAL DRIP 08/23/2009  .  ASTHMA 08/01/2009  . ARTHRITIS 08/01/2009  . SHORTNESS OF BREATH (SOB) 08/01/2009    Alexia Freestone 10/04/2015, 5:00 PM  [place], [place], [address] Phone: [phone]   Fax:  [phone]  Name: Anna Kennedy MRN: [id number] Date of Birth: [birth year]    Allyson Sabal, PT 10/04/2015 5:00 PM

## 2015-10-06 ENCOUNTER — Ambulatory Visit: Payer: BLUE CROSS/BLUE SHIELD | Admitting: Physical Therapy

## 2015-10-06 ENCOUNTER — Encounter: Payer: Self-pay | Admitting: Physical Therapy

## 2015-10-06 DIAGNOSIS — I890 Lymphedema, not elsewhere classified: Secondary | ICD-10-CM | POA: Diagnosis not present

## 2015-10-06 NOTE — Therapy (Signed)
[place], [place], [address] Phone: [phone]   Fax:  [phone]  Physical Therapy Treatment  Patient Details  Name: Anna Kennedy MRN: [id number] Date of Birth: 22-Jul-1950 Referring Provider: Darcus Austin  Encounter Date: 10/06/2015      PT End of Session - 10/06/15 1531    Visit Number 28   Number of Visits 40   Date for PT Re-Evaluation 10/08/15   Authorization Type April cert completed   PT Start Time 1435   PT Stop Time 1520   PT Time Calculation (min) 45 min   Activity Tolerance Patient tolerated treatment well   Behavior During Therapy [facility] for tasks assessed/performed      Past Medical History  Diagnosis Date  . Retinal tear     treated previously with laser surgery  . Atypical chest pain   . Other social stressor   . Osteoarthritis of both knees   . History of depression   . History of asthma   . Cancer ([place])     ovarian    Past Surgical History  Procedure Laterality Date  . Knee surgery      Of her knees  . Foot surgery      Right foot    There were no vitals filed for this visit.      Subjective Assessment - 10/06/15 1437    Subjective My CircAid has been bothering my at night. I have not been able to sleep in it. The CircAid is bothering behind my knee and on top of my foot. My left leg is not bothering me at all.    Pertinent History Was diagnosed Nov 25,2013 with ovarian cancer. Pt underwent a total hysterectomy in Dec 2013 at that time no lymph nodes were involved. Pt is recieving chemotherapy infusions but did not require radiation. Now one lymph node is involved in right groin and pt has edema of  bilateral LEs with right worse than left. They initially thought it was cellulitis and she was hospitilized but later pt learned it was lymphedema. Pt has never had a DVT.   How long can you sit comfortably? approx 45 min to an hour, pt tries to get up every 20 minutes  or so at work   Patient Stated Goals to get LE swelling under control and get some relief   Currently in Pain? No/denies                         [facility] Adult PT Treatment/Exercise - 10/06/15 0001    Manual Therapy   Manual Lymphatic Drainage (MLD) Manual lymph drainage to RLE in supine: short neck, superficial and deep abdominals right axilla, right inguino-axillary pathways; Right LE anterior lower abdomen, lateral hip and then medial to lateral hip redirecting along pathway; right lower leg and ankle/dorsal foot redirecting along lateral hip and then pathways   Compression Bandaging pt reapplied bilateral flat knit compression stockings                   Short Term Clinic Goals - 09/29/15 1646    CC Short Term Goal  #1   Title Pt to demonstrate a 3 cm decrease of right thigh edema (20 cm above suprapatella) to decrease risk of cellulitis   Baseline 51.6 cm; 54.5 cm 07/20/15 but the bandage had slid beneath this point and pt had great reductions below this causing increase fluid above, 3//16: 51; 08/03/15  50.4 cm; 52.8 08/24/15 so resumed Rt LE bandaging today, 09/09/15-50.9, 09/19/15- 50.3, 09/29/15- 52.6 cm    Time 4   Period Weeks   Status On-going   CC Short Term Goal  #2   Title Pt to demonstrate a 2 cm decrease of right calf edema (30cm proximal to floor) to decrease risk of cellulitis   Baseline 38 cm; 36.2 cm 07/20/15, 32 cm on 3//16/2017,    Status Achieved             Long Term Clinic Goals - 09/29/15 1648    CC Long Term Goal  #1   Title Pt to demonstrate a 6 cm decrease of right thigh edema (20 cm above suprapatella) to decrease risk of cellulitis   Status Achieved   CC Long Term Goal  #2   Title Pt to demonstrate a 4 cm decrease of right calf edema (30cm proximal to floor) to decrease risk of cellulitis   Status Achieved   CC Long Term Goal  #3   Title Pt to be receive with appropriate compression garments for day and night time use for long term  management of edema   Baseline patient has received compression stockings for day time use, pt to be measured for night time garment, pt to receive trial of FlexiTouch compression pump, 09/09/15- pt to receive compression pump on Monday, she has a nighttime garment for RLE, circular knit compression thigh highs for BLE and was measured today for flat knit compression thigh highs for better containment, 09/19/15- pt has compression pump and is awaiting arrival of flat knit thigh highs, 09/29/15- Pt has received flat knit thigh high compression stockings and they are managing edema better than circular knit   Status Achieved   CC Long Term Goal  #4   Title Pt to verbalize signs/symptoms of infection in bilateral LEs and lymphedema precautions   Status Achieved            Plan - 10/06/15 1528    Clinical Impression Statement Pt is having pain on top of right foot and still tenderness behind the knee. She may be having anterior tibialis tendonitis from the pressure of the compression. Pt was educated to wear compression stockings during the day and nothing at night to give the foot a break from compression and see if this improves her pain. Her abdomen has gone down a half an inch per pt report. Pt will need a recert next session.    Rehab Potential Good   Clinical Impairments Affecting Rehab Potential pt goes for chemotherapy infusions   PT Frequency 2x / week   PT Duration 8 weeks   PT Treatment/Interventions Vasopneumatic Device;Manual lymph drainage;Compression bandaging;Therapeutic exercise   PT Next Visit Plan remeasure, MLD to RLE only, assess R foot pain, RECERT, add goal for decreased pain in right foot with compression garments   PT Home Exercise Plan use compression pump daily on RLE   Consulted and Agree with Plan of Care Patient      Patient will benefit from skilled therapeutic intervention in order to improve the following deficits and impairments:  Increased edema  Visit  Diagnosis: Lymphedema, not elsewhere classified     Problem List Patient Active Problem List   Diagnosis Date Noted  . POSTNASAL DRIP 08/23/2009  . ASTHMA 08/01/2009  . ARTHRITIS 08/01/2009  . SHORTNESS OF BREATH (SOB) 08/01/2009    Anna Kennedy 10/06/2015, 3:40 PM  [place]  [place], [place], [address] Phone: [phone]   Fax:  [phone]  Name: Anna Kennedy MRN: [id number] Date of Birth: Mar 18, 1951    Allyson Sabal, PT 10/06/2015 3:40 PM

## 2015-10-12 ENCOUNTER — Ambulatory Visit: Payer: BLUE CROSS/BLUE SHIELD

## 2015-10-12 DIAGNOSIS — I890 Lymphedema, not elsewhere classified: Secondary | ICD-10-CM | POA: Diagnosis not present

## 2015-10-12 DIAGNOSIS — R29898 Other symptoms and signs involving the musculoskeletal system: Secondary | ICD-10-CM

## 2015-10-12 NOTE — Therapy (Signed)
[place], [place], [address] Phone: [phone]   Fax:  [phone]  Physical Therapy Treatment  Patient Details  Name: Anna Kennedy MRN: [id number] Date of Birth: 1950/07/21 Referring Provider: Darcus Austin  Encounter Date: 10/12/2015      PT End of Session - 10/12/15 1520    Visit Number 29   Number of Visits 40   Date for PT Re-Evaluation 11/09/15   PT Start Time [id number]   PT Stop Time 1520   PT Time Calculation (min) 43 min   Activity Tolerance Patient tolerated treatment well   Behavior During Therapy [facility] for tasks assessed/performed      Past Medical History  Diagnosis Date  . Retinal tear     treated previously with laser surgery  . Atypical chest pain   . Other social stressor   . Osteoarthritis of both knees   . History of depression   . History of asthma   . Cancer ([place])     ovarian    Past Surgical History  Procedure Laterality Date  . Knee surgery      Of her knees  . Foot surgery      Right foot    There were no vitals filed for this visit.      Subjective Assessment - 10/12/15 1441    Subjective No improvement since last visit with the foot pain. And I don't feel like the swelling is better, actually maybe worse today in my Rt LE.   Pertinent History Was diagnosed Nov 25,2013 with ovarian cancer. Pt underwent a total hysterectomy in Dec 2013 at that time no lymph nodes were involved. Pt is recieving chemotherapy infusions but did not require radiation. Now one lymph node is involved in right groin and pt has edema of  bilateral LEs with right worse than left. They initially thought it was cellulitis and she was hospitilized but later pt learned it was lymphedema. Pt has never had a DVT.   Patient Stated Goals to get LE swelling under control and get some relief   Currently in Pain? No/denies               LYMPHEDEMA/ONCOLOGY QUESTIONNAIRE - 10/12/15 1444     Right Lower Extremity Lymphedema   30 cm Proximal to Suprapatella 60.6 cm   20 cm Proximal to Suprapatella 53.5 cm   10 cm Proximal to Suprapatella 48.4 cm   At Midpatella/Popliteal Crease 38.6 cm   30 cm Proximal to Floor at Lateral Plantar Foot 37.3 cm   20 cm Proximal to Floor at Lateral Plantar Foot 29 1   10  cm Proximal to Floor at Lateral Malleoli 22.1 cm   5 cm Proximal to 1st MTP Joint 22.4 cm   Around Proximal Great Toe 8.2 cm                  [facility] Adult PT Treatment/Exercise - 10/12/15 0001    Manual Therapy   Manual Lymphatic Drainage (MLD) Manual lymph drainage to RLE in supine: short neck, superficial and deep abdominals right axilla, right inguino-axillary pathways; Right LE anterior lower abdomen, lateral hip and then medial to lateral hip redirecting along pathway; right lower leg and ankle/dorsal foot redirecting along lateral hip and then pathways   Compression Bandaging pt reapplied bilateral flat knit compression stockings                   Short Term Clinic Goals - 10/12/15  [place] Term Goal  #1   Title Pt to demonstrate a 3 cm decrease of right thigh edema (20 cm above suprapatella) to decrease risk of cellulitis   Baseline 51.6 cm; 54.5 cm 07/20/15 but the bandage had slid beneath this point and pt had great reductions below this causing increase fluid above, 3//16: 51; 08/03/15 50.4 cm; 52.8 08/24/15 so resumed Rt LE bandaging today, 09/09/15-50.9, 09/19/15- 50.3, 09/29/15- 52.6 cm , 53.5 cm 10/12/15   Status On-going   CC Short Term Goal  #2   Title Pt to demonstrate a 2 cm decrease of right calf edema (30cm proximal to floor) to decrease risk of cellulitis   Status Achieved             Long Term Clinic Goals - 10/12/15 1720    CC Long Term Goal  #1   Title Pt to demonstrate a 6 cm decrease of right thigh edema (20 cm above suprapatella) to decrease risk of cellulitis   Baseline 51.6 cm, 53.5 cm 10/12/15   Status On-going   CC Long  Term Goal  #2   Title Pt to demonstrate a 4 cm decrease of right calf edema (30cm proximal to floor) to decrease risk of cellulitis   Baseline 38, 32 cm on 07/28/2015, 37.3 cm 10/12/15   Status On-going   CC Long Term Goal  #3   Title Pt to be receive with appropriate compression garments for day and night time use for long term management of edema   Status Achieved   CC Long Term Goal  #4   Title Pt to verbalize signs/symptoms of infection in bilateral LEs and lymphedema precautions   Status Achieved            Plan - 10/12/15 1520    Clinical Impression Statement Pt still presenting with pain at top of foot and when in sitting and pt plantar flexes her dorsal foot near toes became discolored. Discussed further with pt her seeing a doctor since she has tried changing a few things and nothing has helped (like going without compression, exercising in the pool with less weight bearing) and she is going out of town end of this week. Also her circumference measurements were increase from last week due to pt hasn't been tolerating her compression very well with the current foot pain. Pt also had noticeable petechiae at her dorsal foot near her toes which she didn't remember being there yesterday or even earlier in the day.    Rehab Potential Good   Clinical Impairments Affecting Rehab Potential pt goes for chemotherapy infusions   PT Frequency 1x / week   PT Duration 4 weeks   PT Treatment/Interventions Vasopneumatic Device;Manual lymph drainage;Compression bandaging;Therapeutic exercise   PT Next Visit Plan Assess if pt was able to see an orthopedist re: her Rt foot pain, cont Rt LE MLD and reassess goals.     Consulted and Agree with Plan of Care Patient      Patient will benefit from skilled therapeutic intervention in order to improve the following deficits and impairments:  Increased edema  Visit Diagnosis: Lymphedema, not elsewhere classified - Plan: PT plan of care  cert/re-cert  Other symptoms and signs involving the musculoskeletal system - Plan: PT plan of care cert/re-cert     Problem List Patient Active Problem List   Diagnosis Date Noted  . POSTNASAL DRIP 08/23/2009  . ASTHMA 08/01/2009  . ARTHRITIS 08/01/2009  . SHORTNESS OF BREATH (SOB)  08/01/2009    SALISBURY,DONNA, PTA 10/12/2015, 9:26 PM  [place], [place], [address] Phone: [phone]   Fax:  [phone]  Name: ABAIGEAL BAAB MRN: [id number] Date of Birth: Aug 12, 1950    Serafina Royals, PT 10/12/2015 9:26 PM

## 2015-10-13 ENCOUNTER — Other Ambulatory Visit: Payer: Self-pay | Admitting: Family Medicine

## 2015-10-13 ENCOUNTER — Ambulatory Visit
Admission: RE | Admit: 2015-10-13 | Discharge: 2015-10-13 | Disposition: A | Discharge status: Home / Self Care | Payer: BLUE CROSS/BLUE SHIELD | Source: Ambulatory Visit | Attending: Family Medicine | Admitting: Family Medicine

## 2015-10-13 DIAGNOSIS — M25571 Pain in right ankle and joints of right foot: Secondary | ICD-10-CM

## 2015-10-13 DIAGNOSIS — M25471 Effusion, right ankle: Secondary | ICD-10-CM

## 2015-10-26 ENCOUNTER — Ambulatory Visit: Payer: BLUE CROSS/BLUE SHIELD | Attending: Family Medicine | Admitting: Physical Therapy

## 2015-10-26 DIAGNOSIS — I890 Lymphedema, not elsewhere classified: Secondary | ICD-10-CM | POA: Diagnosis not present

## 2015-10-26 NOTE — Therapy (Signed)
[place], [place], [address] Phone: [phone]   Fax:  [phone]  Physical Therapy Treatment  Patient Details  Name: Anna Kennedy MRN: [id number] Date of Birth: 1951-02-21 Referring Provider: Darcus Austin  Encounter Date: 10/26/2015      PT End of Session - 10/26/15 1711    Visit Number 30   Number of Visits 40   Date for PT Re-Evaluation 11/09/15   PT Start Time [id number]   PT Stop Time 1601   PT Time Calculation (min) 45 min   Activity Tolerance Patient tolerated treatment well   Behavior During Therapy [facility] for tasks assessed/performed      Past Medical History  Diagnosis Date  . Retinal tear     treated previously with laser surgery  . Atypical chest pain   . Other social stressor   . Osteoarthritis of both knees   . History of depression   . History of asthma   . Cancer ([place])     ovarian    Past Surgical History  Procedure Laterality Date  . Knee surgery      Of her knees  . Foot surgery      Right foot    There were no vitals filed for this visit.      Subjective Assessment - 10/26/15 1517    Subjective After last visit, I went to the doctor about the foot.  They did an x-ray and that was fine; they sent me for a Doppler and that was fine; they put me on clindomycin thinking it was cellulitis.  That may have helped because it doesn't hurt as  much.  Went up to [place] and did okay wearing support hose.  Circaid seems to put pressure on joint area; swelling goes down at night without it, so doesn't use it some nights.  Uses the compression pump.  Leg swelling seems a little less uncomfortable and the support hose seems more comfortable.  Is getting a drain put in for abdominal swelling tomorrow.   Currently in Pain? Yes   Pain Score 3    Pain Location Abdomen  one hernia area   Pain Orientation Left   Pain Descriptors / Indicators Sharp  prickly   Pain Frequency Intermittent    Aggravating Factors  possibly eating   Pain Relieving Factors mornings are better               LYMPHEDEMA/ONCOLOGY QUESTIONNAIRE - 10/26/15 1522    Right Lower Extremity Lymphedema   30 cm Proximal to Suprapatella 62 cm   20 cm Proximal to Suprapatella 52.6 cm   10 cm Proximal to Suprapatella 46.5 cm   At Midpatella/Popliteal Crease 37.5 cm   30 cm Proximal to Floor at Lateral Plantar Foot 34 cm   20 cm Proximal to Floor at Lateral Plantar Foot 25.8 1   10  cm Proximal to Floor at Lateral Malleoli 22 cm   5 cm Proximal to 1st MTP Joint 22 cm   Around Proximal Great Toe 7.8 cm                  [facility] Adult PT Treatment/Exercise - 10/26/15 0001    Manual Therapy   Edema Management circumference measurements taken.   Manual Lymphatic Drainage (MLD) Manual lymph drainage:  patient performed diaphragmatic breathing; short neck, right axilla and inguino-axillary anastomosis, and right LE from dorsal foot to lateral thigh in supine; then in left sidelying, posterior and lateral right  thigh and right inguino-axillary anastomosis.   Compression Bandaging pt reapplied right flat knit compression stockings                   Short Term Clinic Goals - 10/26/15 1720    CC Short Term Goal  #1   Title Pt to demonstrate a 3 cm decrease of right thigh edema (20 cm above suprapatella) to decrease risk of cellulitis   Status On-going             Long Term Clinic Goals - 10/26/15 1716    CC Long Term Goal  #1   Title Pt to demonstrate a 6 cm decrease of right thigh edema (20 cm above suprapatella) to decrease risk of cellulitis   Status On-going   CC Long Term Goal  #2   Title Pt to demonstrate a 4 cm decrease of right calf edema (30cm proximal to floor) to decrease risk of cellulitis   Status Achieved            Plan - 10/26/15 1711    Clinical Impression Statement Patient's leg circumferences remained fairly stable over the past couple of weeks, even  while she traveled up to [place].  She had some mild improvements at some levels.  She is anxious today because she is to have a drain placed in her abdomen for her fluid build-up there, and it is hoped that it will not only decrease the fluid but decrease the hernia that is bothering her.     Rehab Potential Good   Clinical Impairments Affecting Rehab Potential pt goes for chemotherapy infusions   PT Frequency 1x / week   PT Duration 4 weeks   PT Treatment/Interventions Vasopneumatic Device;Manual lymph drainage;Compression bandaging;Therapeutic exercise   PT Next Visit Plan Patient and therapist agreed that continuing one time a week makes sense, to continue manual lymph drainage and monitoring her swelling.   PT Home Exercise Plan use compression pump daily on RLE   Consulted and Agree with Plan of Care Patient      Patient will benefit from skilled therapeutic intervention in order to improve the following deficits and impairments:  Increased edema  Visit Diagnosis: Lymphedema, not elsewhere classified     Problem List Patient Active Problem List   Diagnosis Date Noted  . POSTNASAL DRIP 08/23/2009  . ASTHMA 08/01/2009  . ARTHRITIS 08/01/2009  . SHORTNESS OF BREATH (SOB) 08/01/2009    AnnaDONNA 10/26/2015, 5:21 PM  [place], [place], [address] Phone: [phone]   Fax:  [phone]  Name: Anna Kennedy MRN: [id number] Date of Birth: 1951/03/22    Serafina Royals, PT 10/26/2015 5:21 PM

## 2015-10-30 ENCOUNTER — Emergency Department (HOSPITAL_COMMUNITY): Payer: BLUE CROSS/BLUE SHIELD | Admitting: Certified Registered"

## 2015-10-30 ENCOUNTER — Encounter (HOSPITAL_COMMUNITY): Payer: Self-pay | Admitting: Emergency Medicine

## 2015-10-30 ENCOUNTER — Inpatient Hospital Stay (HOSPITAL_COMMUNITY)
Admission: EM | Admit: 2015-10-30 | Discharge: 2015-11-05 | DRG: 354 | Disposition: A | Discharge status: Home / Self Care | Payer: BLUE CROSS/BLUE SHIELD | Attending: General Surgery | Admitting: General Surgery

## 2015-10-30 ENCOUNTER — Emergency Department (HOSPITAL_COMMUNITY): Payer: BLUE CROSS/BLUE SHIELD

## 2015-10-30 ENCOUNTER — Encounter (HOSPITAL_COMMUNITY): Admission: EM | Disposition: A | Discharge status: Home / Self Care | Payer: Self-pay | Source: Home / Self Care

## 2015-10-30 DIAGNOSIS — M170 Bilateral primary osteoarthritis of knee: Secondary | ICD-10-CM | POA: Diagnosis present

## 2015-10-30 DIAGNOSIS — Z9049 Acquired absence of other specified parts of digestive tract: Secondary | ICD-10-CM | POA: Diagnosis not present

## 2015-10-30 DIAGNOSIS — R188 Other ascites: Secondary | ICD-10-CM | POA: Diagnosis present

## 2015-10-30 DIAGNOSIS — N736 Female pelvic peritoneal adhesions (postinfective): Secondary | ICD-10-CM | POA: Diagnosis present

## 2015-10-30 DIAGNOSIS — K567 Ileus, unspecified: Secondary | ICD-10-CM | POA: Diagnosis not present

## 2015-10-30 DIAGNOSIS — K420 Umbilical hernia with obstruction, without gangrene: Secondary | ICD-10-CM

## 2015-10-30 DIAGNOSIS — J45909 Unspecified asthma, uncomplicated: Secondary | ICD-10-CM | POA: Diagnosis present

## 2015-10-30 DIAGNOSIS — Z8249 Family history of ischemic heart disease and other diseases of the circulatory system: Secondary | ICD-10-CM | POA: Diagnosis not present

## 2015-10-30 DIAGNOSIS — C569 Malignant neoplasm of unspecified ovary: Secondary | ICD-10-CM | POA: Diagnosis present

## 2015-10-30 DIAGNOSIS — I890 Lymphedema, not elsewhere classified: Secondary | ICD-10-CM | POA: Diagnosis present

## 2015-10-30 DIAGNOSIS — R109 Unspecified abdominal pain: Secondary | ICD-10-CM | POA: Diagnosis present

## 2015-10-30 DIAGNOSIS — F329 Major depressive disorder, single episode, unspecified: Secondary | ICD-10-CM | POA: Diagnosis present

## 2015-10-30 DIAGNOSIS — Z8719 Personal history of other diseases of the digestive system: Secondary | ICD-10-CM

## 2015-10-30 DIAGNOSIS — K660 Peritoneal adhesions (postprocedural) (postinfection): Secondary | ICD-10-CM | POA: Diagnosis present

## 2015-10-30 DIAGNOSIS — Z90710 Acquired absence of both cervix and uterus: Secondary | ICD-10-CM | POA: Diagnosis not present

## 2015-10-30 DIAGNOSIS — C786 Secondary malignant neoplasm of retroperitoneum and peritoneum: Secondary | ICD-10-CM | POA: Diagnosis present

## 2015-10-30 DIAGNOSIS — K430 Incisional hernia with obstruction, without gangrene: Secondary | ICD-10-CM | POA: Diagnosis present

## 2015-10-30 DIAGNOSIS — Z9889 Other specified postprocedural states: Secondary | ICD-10-CM

## 2015-10-30 HISTORY — PX: INSERTION OF MESH: SHX5868

## 2015-10-30 HISTORY — PX: LAPAROTOMY: SHX154

## 2015-10-30 LAB — URINALYSIS, ROUTINE W REFLEX MICROSCOPIC
BILIRUBIN URINE: NEGATIVE
GLUCOSE, UA: NEGATIVE mg/dL
HGB URINE DIPSTICK: NEGATIVE
KETONES UR: NEGATIVE mg/dL
Leukocytes, UA: NEGATIVE
Nitrite: NEGATIVE
PROTEIN: NEGATIVE mg/dL
Specific Gravity, Urine: 1.037 — ABNORMAL HIGH (ref 1.005–1.030)
pH: 6.5 (ref 5.0–8.0)

## 2015-10-30 LAB — COMPREHENSIVE METABOLIC PANEL
ALBUMIN: 3.4 g/dL — AB (ref 3.5–5.0)
ALK PHOS: 56 U/L (ref 38–126)
ALT: 22 U/L (ref 14–54)
AST: 27 U/L (ref 15–41)
Anion gap: 9 (ref 5–15)
BUN: 15 mg/dL (ref 6–20)
CALCIUM: 10.1 mg/dL (ref 8.9–10.3)
CHLORIDE: 101 mmol/L (ref 101–111)
CO2: 26 mmol/L (ref 22–32)
CREATININE: 0.58 mg/dL (ref 0.44–1.00)
GFR calc Af Amer: >60 mL/min (ref >60)
GFR calc non Af Amer: >60 mL/min (ref >60)
GLUCOSE: 114 mg/dL — AB (ref 65–99)
Potassium: 4.1 mmol/L (ref 3.5–5.1)
SODIUM: 136 mmol/L (ref 135–145)
Total Bilirubin: 0.8 mg/dL (ref 0.3–1.2)
Total Protein: 6.9 g/dL (ref 6.5–8.1)

## 2015-10-30 LAB — CBC
HCT: 41 % (ref 36.0–46.0)
Hemoglobin: 14.2 g/dL (ref 12.0–15.0)
MCH: 31 pg (ref 26.0–34.0)
MCHC: 34.6 g/dL (ref 30.0–36.0)
MCV: 89.5 fL (ref 78.0–100.0)
PLATELETS: 390 10*3/uL (ref 150–400)
RBC: 4.58 MIL/uL (ref 3.87–5.11)
RDW: 16 % — AB (ref 11.5–15.5)
WBC: 6.1 10*3/uL (ref 4.0–10.5)

## 2015-10-30 LAB — LIPASE, BLOOD: LIPASE: 23 U/L (ref 11–51)

## 2015-10-30 SURGERY — LAPAROTOMY, EXPLORATORY
Anesthesia: General | Site: Abdomen

## 2015-10-30 MED ORDER — MORPHINE SULFATE (PF) 2 MG/ML IV SOLN
2.0000 mg | INTRAVENOUS | Status: DC | PRN
  Administered 2015-10-30: 2 mg via INTRAVENOUS
  Administered 2015-10-31: 3 mg via INTRAVENOUS
  Administered 2015-10-31 – 2015-11-04 (×20): 2 mg via INTRAVENOUS
  Filled 2015-10-30 (×9): qty 1
  Filled 2015-10-30: qty 2
  Filled 2015-10-30 (×14): qty 1

## 2015-10-30 MED ORDER — LACTATED RINGERS IV SOLN
INTRAVENOUS | Status: DC | PRN
  Administered 2015-10-30 (×2): via INTRAVENOUS

## 2015-10-30 MED ORDER — ONDANSETRON HCL 4 MG/2ML IJ SOLN
4.0000 mg | Freq: Once | INTRAMUSCULAR | Status: AC | PRN
  Administered 2015-10-30: 4 mg via INTRAVENOUS
  Filled 2015-10-30: qty 2

## 2015-10-30 MED ORDER — ROCURONIUM BROMIDE 100 MG/10ML IV SOLN
INTRAVENOUS | Status: DC | PRN
  Administered 2015-10-30: 30 mg via INTRAVENOUS
  Administered 2015-10-30: 20 mg via INTRAVENOUS
  Administered 2015-10-30: 10 mg via INTRAVENOUS
  Administered 2015-10-30: 20 mg via INTRAVENOUS

## 2015-10-30 MED ORDER — FAMOTIDINE IN NACL 20-0.9 MG/50ML-% IV SOLN
20.0000 mg | Freq: Two times a day (BID) | INTRAVENOUS | Status: DC
  Administered 2015-10-30 – 2015-11-03 (×9): 20 mg via INTRAVENOUS
  Filled 2015-10-30 (×10): qty 50

## 2015-10-30 MED ORDER — DEXTROSE 5 % IV SOLN
2.0000 g | INTRAVENOUS | Status: AC
  Administered 2015-10-30: 2 g via INTRAVENOUS

## 2015-10-30 MED ORDER — SUGAMMADEX SODIUM 200 MG/2ML IV SOLN
INTRAVENOUS | Status: AC
  Filled 2015-10-30: qty 2

## 2015-10-30 MED ORDER — PROPOFOL 10 MG/ML IV BOLUS
INTRAVENOUS | Status: AC
  Filled 2015-10-30: qty 20

## 2015-10-30 MED ORDER — KCL IN DEXTROSE-NACL 20-5-0.9 MEQ/L-%-% IV SOLN
INTRAVENOUS | Status: DC
  Administered 2015-10-30 – 2015-11-04 (×10): via INTRAVENOUS
  Filled 2015-10-30 (×13): qty 1000

## 2015-10-30 MED ORDER — DEXTROSE 5 % IV SOLN
INTRAVENOUS | Status: AC
  Filled 2015-10-30: qty 2

## 2015-10-30 MED ORDER — ONDANSETRON HCL 4 MG/2ML IJ SOLN
4.0000 mg | Freq: Once | INTRAMUSCULAR | Status: AC
  Administered 2015-10-30: 4 mg via INTRAVENOUS
  Filled 2015-10-30: qty 2

## 2015-10-30 MED ORDER — LIDOCAINE HCL (PF) 2 % IJ SOLN
INTRAMUSCULAR | Status: DC | PRN
  Administered 2015-10-30: 30 mg via INTRADERMAL

## 2015-10-30 MED ORDER — PHENYLEPHRINE 40 MCG/ML (10ML) SYRINGE FOR IV PUSH (FOR BLOOD PRESSURE SUPPORT)
PREFILLED_SYRINGE | INTRAVENOUS | Status: DC | PRN
  Administered 2015-10-30: 80 ug via INTRAVENOUS

## 2015-10-30 MED ORDER — MORPHINE SULFATE (PF) 4 MG/ML IV SOLN
4.0000 mg | Freq: Once | INTRAVENOUS | Status: DC
  Filled 2015-10-30: qty 1

## 2015-10-30 MED ORDER — FENTANYL CITRATE (PF) 100 MCG/2ML IJ SOLN
INTRAMUSCULAR | Status: DC | PRN
  Administered 2015-10-30: 50 ug via INTRAVENOUS
  Administered 2015-10-30: 100 ug via INTRAVENOUS
  Administered 2015-10-30 (×2): 50 ug via INTRAVENOUS

## 2015-10-30 MED ORDER — 0.9 % SODIUM CHLORIDE (POUR BTL) OPTIME
TOPICAL | Status: DC | PRN
  Administered 2015-10-30: 2000 mL

## 2015-10-30 MED ORDER — DEXAMETHASONE SODIUM PHOSPHATE 10 MG/ML IJ SOLN
INTRAMUSCULAR | Status: AC
  Filled 2015-10-30: qty 1

## 2015-10-30 MED ORDER — SODIUM CHLORIDE 0.9 % IV SOLN
INTRAVENOUS | Status: DC
  Administered 2015-10-30: 09:00:00 via INTRAVENOUS

## 2015-10-30 MED ORDER — ONDANSETRON 4 MG PO TBDP: 4.0000 mg | ORAL_TABLET | Freq: Four times a day (QID) | ORAL | Status: DC | PRN

## 2015-10-30 MED ORDER — DEXAMETHASONE SODIUM PHOSPHATE 10 MG/ML IJ SOLN
INTRAMUSCULAR | Status: DC | PRN
  Administered 2015-10-30: 10 mg via INTRAVENOUS

## 2015-10-30 MED ORDER — ONDANSETRON HCL 4 MG/2ML IJ SOLN
4.0000 mg | Freq: Four times a day (QID) | INTRAMUSCULAR | Status: DC | PRN
  Administered 2015-11-04: 4 mg via INTRAVENOUS
  Filled 2015-10-30: qty 2

## 2015-10-30 MED ORDER — SUGAMMADEX SODIUM 200 MG/2ML IV SOLN
INTRAVENOUS | Status: DC | PRN
  Administered 2015-10-30: 200 mg via INTRAVENOUS

## 2015-10-30 MED ORDER — HYDROMORPHONE HCL 1 MG/ML IJ SOLN
0.2500 mg | INTRAMUSCULAR | Status: DC | PRN
  Administered 2015-10-30 (×4): 0.5 mg via INTRAVENOUS

## 2015-10-30 MED ORDER — PHENYLEPHRINE 40 MCG/ML (10ML) SYRINGE FOR IV PUSH (FOR BLOOD PRESSURE SUPPORT)
PREFILLED_SYRINGE | INTRAVENOUS | Status: AC
  Filled 2015-10-30: qty 10

## 2015-10-30 MED ORDER — SODIUM CHLORIDE 0.9 % IV BOLUS (SEPSIS)
1000.0000 mL | Freq: Once | INTRAVENOUS | Status: AC
  Administered 2015-10-30: 1000 mL via INTRAVENOUS

## 2015-10-30 MED ORDER — KCL IN DEXTROSE-NACL 20-5-0.9 MEQ/L-%-% IV SOLN
INTRAVENOUS | Status: AC
  Filled 2015-10-30: qty 1000

## 2015-10-30 MED ORDER — HYDROMORPHONE HCL 1 MG/ML IJ SOLN
INTRAMUSCULAR | Status: AC
  Filled 2015-10-30: qty 1

## 2015-10-30 MED ORDER — SUCCINYLCHOLINE CHLORIDE 20 MG/ML IJ SOLN
INTRAMUSCULAR | Status: DC | PRN
  Administered 2015-10-30: 100 mg via INTRAVENOUS

## 2015-10-30 MED ORDER — PROPOFOL 10 MG/ML IV BOLUS
INTRAVENOUS | Status: DC | PRN
  Administered 2015-10-30: 130 mg via INTRAVENOUS

## 2015-10-30 MED ORDER — ONDANSETRON HCL 4 MG/2ML IJ SOLN
INTRAMUSCULAR | Status: AC
  Filled 2015-10-30: qty 2

## 2015-10-30 MED ORDER — FENTANYL CITRATE (PF) 250 MCG/5ML IJ SOLN
INTRAMUSCULAR | Status: AC
  Filled 2015-10-30: qty 5

## 2015-10-30 MED ORDER — MIDAZOLAM HCL 5 MG/5ML IJ SOLN
INTRAMUSCULAR | Status: DC | PRN
  Administered 2015-10-30: 2 mg via INTRAVENOUS

## 2015-10-30 MED ORDER — ENOXAPARIN SODIUM 40 MG/0.4ML ~~LOC~~ SOLN
40.0000 mg | SUBCUTANEOUS | Status: DC
  Administered 2015-10-31 – 2015-11-05 (×6): 40 mg via SUBCUTANEOUS
  Filled 2015-10-30 (×7): qty 0.4

## 2015-10-30 MED ORDER — PROMETHAZINE HCL 25 MG/ML IJ SOLN
6.2500 mg | INTRAMUSCULAR | Status: AC | PRN
Start: 2015-10-30 — End: 2015-10-30
  Administered 2015-10-30 (×2): 6.25 mg via INTRAVENOUS

## 2015-10-30 MED ORDER — ONDANSETRON HCL 4 MG/2ML IJ SOLN
INTRAMUSCULAR | Status: DC | PRN
  Administered 2015-10-30: 4 mg via INTRAVENOUS

## 2015-10-30 MED ORDER — MIDAZOLAM HCL 2 MG/2ML IJ SOLN
INTRAMUSCULAR | Status: AC
  Filled 2015-10-30: qty 2

## 2015-10-30 MED ORDER — IOPAMIDOL (ISOVUE-300) INJECTION 61%
100.0000 mL | Freq: Once | INTRAVENOUS | Status: AC | PRN
  Administered 2015-10-30: 100 mL via INTRAVENOUS

## 2015-10-30 MED ORDER — LACTATED RINGERS IV SOLN: INTRAVENOUS | Status: DC

## 2015-10-30 MED ORDER — ROCURONIUM BROMIDE 100 MG/10ML IV SOLN
INTRAVENOUS | Status: AC
  Filled 2015-10-30: qty 1

## 2015-10-30 MED ORDER — PROMETHAZINE HCL 25 MG/ML IJ SOLN
INTRAMUSCULAR | Status: AC
  Administered 2015-10-30: 6.25 mg
  Filled 2015-10-30: qty 1

## 2015-10-30 MED ORDER — LIDOCAINE HCL (CARDIAC) 20 MG/ML IV SOLN
INTRAVENOUS | Status: AC
  Filled 2015-10-30: qty 5

## 2015-10-30 SURGICAL SUPPLY — 44 items
APPLICATOR COTTON TIP 6IN STRL (MISCELLANEOUS) IMPLANT
BINDER ABDOMINAL 12 ML 46-62 (SOFTGOODS) ×4 IMPLANT
BLADE EXTENDED COATED 6.5IN (ELECTRODE) IMPLANT
BLADE HEX COATED 2.75 (ELECTRODE) ×4 IMPLANT
COVER MAYO STAND STRL (DRAPES) IMPLANT
COVER SURGICAL LIGHT HANDLE (MISCELLANEOUS) IMPLANT
DRAIN CHANNEL 19F RND (DRAIN) ×4 IMPLANT
DRAPE LAPAROSCOPIC ABDOMINAL (DRAPES) ×4 IMPLANT
DRAPE WARM FLUID 44X44 (DRAPE) ×4 IMPLANT
ELECT REM PT RETURN 9FT ADLT (ELECTROSURGICAL) ×4
ELECTRODE REM PT RTRN 9FT ADLT (ELECTROSURGICAL) ×2 IMPLANT
EVACUATOR SILICONE 100CC (DRAIN) ×4 IMPLANT
GAUZE SPONGE 4X4 12PLY STRL (GAUZE/BANDAGES/DRESSINGS) ×4 IMPLANT
GLOVE BIOGEL PI IND STRL 7.0 (GLOVE) ×4 IMPLANT
GLOVE BIOGEL PI INDICATOR 7.0 (GLOVE) ×4
GOWN STRL REUS W/TWL LRG LVL3 (GOWN DISPOSABLE) IMPLANT
GOWN STRL REUS W/TWL XL LVL3 (GOWN DISPOSABLE) ×8 IMPLANT
HOLDER FOLEY CATH W/STRAP (MISCELLANEOUS) ×4 IMPLANT
KIT BASIN OR (CUSTOM PROCEDURE TRAY) ×4 IMPLANT
MESH PHASIX ST 10X15 (Mesh General) ×4 IMPLANT
NS IRRIG 1000ML POUR BTL (IV SOLUTION) ×4 IMPLANT
PACK GENERAL/GYN (CUSTOM PROCEDURE TRAY) ×4 IMPLANT
PEN SKIN MARKING BROAD (MISCELLANEOUS) ×4 IMPLANT
SPONGE LAP 18X18 X RAY DECT (DISPOSABLE) IMPLANT
STAPLER VISISTAT 35W (STAPLE) ×4 IMPLANT
SUCTION POOLE TIP (SUCTIONS) ×4 IMPLANT
SUT ETHILON 2 0 PS N (SUTURE) ×4 IMPLANT
SUT NOVA NAB GS-21 0 18 T12 DT (SUTURE) ×16 IMPLANT
SUT PDS AB 1 CTX 36 (SUTURE) IMPLANT
SUT SILK 2 0 (SUTURE)
SUT SILK 2 0 SH CR/8 (SUTURE) ×4 IMPLANT
SUT SILK 2-0 18XBRD TIE 12 (SUTURE) IMPLANT
SUT SILK 3 0 (SUTURE)
SUT SILK 3 0 SH CR/8 (SUTURE) IMPLANT
SUT SILK 3-0 18XBRD TIE 12 (SUTURE) IMPLANT
SUT VIC AB 3-0 SH 27 (SUTURE) ×4
SUT VIC AB 3-0 SH 27XBRD (SUTURE) ×2 IMPLANT
SWAB COLLECTION DEVICE MRSA (MISCELLANEOUS) ×4 IMPLANT
TAPE CLOTH SURG 4X10 WHT LF (GAUZE/BANDAGES/DRESSINGS) ×4 IMPLANT
TOWEL OR 17X26 10 PK STRL BLUE (TOWEL DISPOSABLE) ×4 IMPLANT
TRAY FOLEY W/METER SILVER 14FR (SET/KITS/TRAYS/PACK) ×8 IMPLANT
TRAY FOLEY W/METER SILVER 16FR (SET/KITS/TRAYS/PACK) IMPLANT
TUBE ANAEROBIC SPECIMEN COL (MISCELLANEOUS) ×4 IMPLANT
YANKAUER SUCT BULB TIP NO VENT (SUCTIONS) IMPLANT

## 2015-10-30 NOTE — ED Notes (Signed)
Patient transported to CT 

## 2015-10-30 NOTE — Op Note (Signed)
Preoperative Diagnosis: Incarcerated ventral incisional hernia with small bowel obstruction  Postoprative Diagnosis: Same  Procedure: Procedure(s):  REPAIR OF INCARCERATED VENTRAL HERNIA INSERTION OF MESH INSERTION OF MESH   Surgeon: Excell Seltzer T   Assistants: None  Anesthesia:  General endotracheal anesthesia  Indications: patient is a 65 year old female with a history of midline laparotomy in 2013 for hysterectomy, partial colectomy and debulking for ovarian cancer. She has recurrent peritoneal disease with ascites and a recent ascites drain placed. She has had a known ventral incisional hernia She is receiving chemotherapy.  She presents with 24 hours of pain and swelling at the hernia site with vomiting and CT shows incarcerated small bowel with small bowel obstruction. I recommended proceeding with emergency repair. We discussed the nature the problem and procedure and possible use of mesh and risks all detailed elsewhere and she and her husband agree to proceed.    Procedure Detail:  Patient was brought to the operating room, placed in the supine position on the operating table, and general endotracheal anesthesia induced. The abdomen was widely sterilely prepped and draped.  She had received preoperative IV antibiotics. Patient timeout was performed and correct procedure verified. PAS were in place. There was a large hernia mass right at the umbilicus in the midline. The previous midline incision was used overlying  The hernia mass and extended several centimeters superior and inferior. Dissection was carried down to the subcutaneous tissue. Hernia sac was exposed. Hernia sac was opened and contained Cloudy peritoneal fluid.  A loop of small bowel was incarcerated  Within the hernia but was not damaged. There were fortunately no significant adhesions around the edge of the hernia defect and I was able to slip my finger through the hernia sac underneath the fascia superiorly and  inferiorly and divided for a very short distance  With cautery opening up the neck of the hernia and releasing the bowel. The bowel was carefully inspected and there was no evidence of damage. There was actually a separate hernia neck just lateral to this one and the 2 were connected with dissection and then a second loop of small bowel reduced out of this. 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 This was cultured as a precaution. Also ascites was suctioned. There was  Moderate to mild amount within the abdomen. Small bowel loops were freed from the entire anterior abdominal wall although there were some  Adhesions down into the pelvis which I left undisturbed.  I was able to examine the majority of small bowel loops in the upper abdomen and the peritoneal surfaces which other than the secondary obstruction from the hernia appeared normal. With these findings I elected to proceed with a formal hernia repair. The hernia sacs were completely excised from the subcutaneous tissue back to the fascial edges. Fascial edges were debrided and defined. Skin and subcutaneous flaps were raised 4-5 cm in all directions for an onlay mesh repair.  The fascia was then primarily closed in the midline with interrupted figure-of-eight sutures of 0 Novafil. This came together with mild tension. I then chose a piece of a Phasix Mesh 10 x 15 cm and this was trimmed to an oblong shape for coverage of the incision were several centimeters in all directions.  This was then sutured as an onlay widely covering the repair with interrupted 0 Novafil. The wound was thoroughly irrigated. I trimmed redundant skin back in all directions.  A close suction drain was  brought out through a separate stab wound and left in the subcutaneous space. Subcutaneous tissue was closed with running  3-0 Vicryl and skin closed with staples. Sponge needle and instrument counts  Were correct. Dry sterile  dressings were applied. Abdominal binder was applied.   Findings: As above  Estimated Blood Loss:  less than 50 mL         Drains: 19 Blake drain in subcutaneous space  Blood Given: none          Specimens: Hernia sac        Complications:  * No complications entered in OR log *         Disposition: PACU - hemodynamically stable.         Condition: stable

## 2015-10-30 NOTE — Anesthesia Preprocedure Evaluation (Addendum)
Anesthesia Evaluation  Patient identified by MRN, date of birth, ID band Patient awake    Reviewed: Allergy & Precautions, H&P , NPO status , Patient's Chart, lab work & pertinent test results  Airway Mallampati: II  TM Distance: >3 FB Neck ROM: Full    Dental no notable dental hx. (+) Teeth Intact, Dental Advisory Given   Pulmonary asthma ,    Pulmonary exam normal breath sounds clear to auscultation       Cardiovascular negative cardio ROS   Rhythm:Regular Rate:Normal     Neuro/Psych negative neurological ROS  negative psych ROS   GI/Hepatic negative GI ROS, Neg liver ROS,   Endo/Other  negative endocrine ROS  Renal/GU negative Renal ROS  negative genitourinary   Musculoskeletal  (+) Arthritis , Osteoarthritis,    Abdominal   Peds  Hematology negative hematology ROS (+)   Anesthesia Other Findings   Reproductive/Obstetrics negative OB ROS                             Anesthesia Physical Anesthesia Plan  ASA: II and emergent  Anesthesia Plan: General   Post-op Pain Management:    Induction: Intravenous, Rapid sequence and Cricoid pressure planned  Airway Management Planned: Oral ETT  Additional Equipment:   Intra-op Plan:   Post-operative Plan: Extubation in OR  Informed Consent: I have reviewed the patients History and Physical, chart, labs and discussed the procedure including the risks, benefits and alternatives for the proposed anesthesia with the patient or authorized representative who has indicated his/her understanding and acceptance.   Dental advisory given  Plan Discussed with: CRNA  Anesthesia Plan Comments:        Anesthesia Quick Evaluation

## 2015-10-30 NOTE — Anesthesia Procedure Notes (Signed)
Procedure Name: Intubation Date/Time: 10/30/2015 11:55 AM Performed by: Lajuana Carry E Pre-anesthesia Checklist: Patient identified, Emergency Drugs available, Suction available and Patient being monitored Patient Re-evaluated:Patient Re-evaluated prior to inductionOxygen Delivery Method: Circle system utilized Preoxygenation: Pre-oxygenation with 100% oxygen Intubation Type: IV induction, Rapid sequence and Cricoid Pressure applied Laryngoscope Size: Miller and 2 Grade View: Grade I Tube type: Oral Tube size: 7.0 mm Number of attempts: 1 Airway Equipment and Method: Stylet Placement Confirmation: ETT inserted through vocal cords under direct vision,  positive ETCO2 and breath sounds checked- equal and bilateral Secured at: 21 cm Tube secured with: Tape Dental Injury: Teeth and Oropharynx as per pre-operative assessment

## 2015-10-30 NOTE — ED Notes (Signed)
Patient is complaining of abdominal pain, nausea, and vomiting. Patient is a chemo patient. Patient states has drain put in on Thursday. She said her hernia is starting to protrude. Patient states her stomach went down after the drain was put in but Saturday her stomach started to get bloated.

## 2015-10-30 NOTE — ED Provider Notes (Signed)
CSN: [id number]     Arrival date & time 10/30/15  [id number] History   First MD Initiated Contact with Patient 10/30/15 [phone]     Chief Complaint  Patient presents with  . Nausea  . Emesis  . Abdominal Pain     (Consider location/radiation/quality/duration/timing/severity/associated sxs/prior Treatment) HPI Comments: Patient here complaining of diffuse abdominal pain with nausea and bilious vomiting 2 days. Has a history of an umbilical hernia which is been protruding more. She is concerned that she may have an obstruction. Does have a history of ovarian cancer and last received chemotherapy 5 days ago. Denies any fever or chills. No black or bloody stools. Denies any dysuria or hematuria. Has used antibiotics without relief. Called her Dr. was told to come in for further evaluation.  Patient is a 65 y.o. female presenting with vomiting and abdominal pain. The history is provided by the patient.  Emesis Associated symptoms: abdominal pain   Abdominal Pain Associated symptoms: vomiting     Past Medical History  Diagnosis Date  . Retinal tear     treated previously with laser surgery  . Atypical chest pain   . Other social stressor   . Osteoarthritis of both knees   . History of depression   . History of asthma   . Cancer ([place])     ovarian   Past Surgical History  Procedure Laterality Date  . Knee surgery      Of her knees  . Foot surgery      Right foot   Family History  Problem Relation Age of Onset  . Arthritis Mother   . Heart disease Father    Social History  Substance Use Topics  . Smoking status: Unknown If Ever Smoked  . Smokeless tobacco: None  . Alcohol Use: Yes   OB History    No data available     Review of Systems  Gastrointestinal: Positive for vomiting and abdominal pain.  All other systems reviewed and are negative.     Allergies  Tegaderm ag mesh  Home Medications   Prior to Admission medications   Medication Sig Start Date End Date Taking?  Authorizing Provider  buPROPion (WELLBUTRIN XL) 300 MG 24 hr tablet Take 300 mg by mouth daily. Reported on 07/14/2015    Historical Provider, MD  Cetirizine HCl (ZYRTEC PO) Take 10 mg by mouth daily as needed. Reported on 07/14/2015    Historical Provider, MD  Cholecalciferol (VITAMIN D PO) Take by mouth daily.    Historical Provider, MD  GLUCOSAMINE PO Take by mouth as directed. Reported on 07/14/2015    Historical Provider, MD  lidocaine-prilocaine (EMLA) cream Apply 1 application topically as needed.    Historical Provider, MD  MAGNESIUM PO Take 1 tablet by mouth daily.     Historical Provider, MD  Montelukast Sodium (SINGULAIR PO) Take by mouth as needed. Reported on 07/14/2015    Historical Provider, MD  Multiple Vitamin (MULTIVITAMIN) tablet Take 1 tablet by mouth daily.    Historical Provider, MD  ondansetron (ZOFRAN) 4 MG tablet Take 4 mg by mouth every 8 (eight) hours as needed for nausea or vomiting.    Historical Provider, MD  Probiotic Product (PROBIOTIC ADVANCED PO) Take 1 capsule by mouth daily.    Historical Provider, MD  pyridOXINE (VITAMIN B-6) 50 MG tablet Take 50 mg by mouth daily.    Historical Provider, MD  sulfamethoxazole-trimethoprim (BACTRIM DS,SEPTRA DS) 800-160 MG per tablet Take 1 tablet by mouth 2 (two) times daily.  Patient not taking: Reported on 07/14/2015 12/27/14   Mercedes Camprubi-Soms, PA-C   BP 134/88 mmHg  Pulse 104  Temp(Src) 97.8 F (36.6 C) (Oral)  Resp 16  Ht 5\' 9"  (1.753 m)  Wt 63.504 kg  BMI 20.67 kg/m2  SpO2 99% Physical Exam  Constitutional: She is oriented to person, place, and time. She appears well-developed and well-nourished.  Non-toxic appearance. No distress.  HENT:  Head: Normocephalic and atraumatic.  Eyes: Conjunctivae, EOM and lids are normal. Pupils are equal, round, and reactive to light.  Neck: Normal range of motion. Neck supple. No tracheal deviation present. No thyroid mass present.  Cardiovascular: Normal rate, regular rhythm and  normal heart sounds.  Exam reveals no gallop.   No murmur heard. Pulmonary/Chest: Effort normal and breath sounds normal. No stridor. No respiratory distress. She has no decreased breath sounds. She has no wheezes. She has no rhonchi. She has no rales.  Abdominal: Soft. She exhibits distension and mass. There is tenderness in the periumbilical area. There is no rigidity, no rebound, no guarding and no CVA tenderness.    Slight abdominal distention.  Musculoskeletal: Normal range of motion. She exhibits no edema or tenderness.  Neurological: She is alert and oriented to person, place, and time. She has normal strength. No cranial nerve deficit or sensory deficit. GCS eye subscore is 4. GCS verbal subscore is 5. GCS motor subscore is 6.  Skin: Skin is warm and dry. No abrasion and no rash noted.  Psychiatric: She has a normal mood and affect. Her speech is normal and behavior is normal.  Nursing note and vitals reviewed.   ED Course  Procedures (including critical care time) Labs Review Labs Reviewed  LIPASE, BLOOD  COMPREHENSIVE METABOLIC PANEL  CBC  URINALYSIS, ROUTINE W REFLEX MICROSCOPIC (NOT AT [facility])    Imaging Review No results found. I have personally reviewed and evaluated these images and lab results as part of my medical decision-making.   EKG Interpretation None      MDM   Final diagnoses:  None    Patient morphine and Zofran here. Abdominal CT consistent with incarcerated umbilical hernia. Discuss with general surgeon on call he'll come and evaluate the patient    Lacretia Leigh, MD 10/30/15 1030

## 2015-10-30 NOTE — H&P (Signed)
Anna Kennedy is an 65 y.o. female.    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 She is receiving ongoing chemotherapy having one treatment last week.All her treatment has been at [facility]. She has a known mid abdominal incisional hernia.She states that last night she developed cramping abdominal pain and then nausea and vomiting.Her hernia has become hard and tender. She presented to the emergency department today. She has been tolerating her chemotherapy well. She has no other majormedical problems.  Past Medical History  Diagnosis Date  . Retinal tear     treated previously with laser surgery  . Atypical chest pain   . Other social stressor   . Osteoarthritis of both knees   . History of depression   . History of asthma   . Cancer ([place])     ovarian    Past Surgical History  Procedure Laterality Date  . Knee surgery      Of her knees  . Foot surgery      Right foot    Family History  Problem Relation Age of Onset  . Arthritis Mother   . Heart disease Father    Social History:  reports that she drinks alcohol. She reports that she does not use illicit drugs. Her tobacco history is not on file.  Allergies:  Allergies  Allergen Reactions  . Cat Hair Extract Itching  . Other Itching  . Tegaderm Ag Mesh [Silver] Rash and Itching    Current Facility-Administered Medications  Medication Dose Route Frequency Provider Last Rate Last Dose  . 0.9 %  sodium chloride infusion   Intravenous Continuous Lacretia Leigh, MD 125 mL/hr at 10/30/15 [phone]    . cefOXitin (MEFOXIN) 2 g in dextrose 5 % 50 mL IVPB  2 g Intravenous NOW Excell Seltzer, MD      . morphine 4 MG/ML injection 4 mg  4 mg Intravenous Once Lacretia Leigh, MD   Stopped at  10/30/15 0840  . ondansetron (ZOFRAN) injection 4 mg  4 mg Intravenous Once PRN Lacretia Leigh, MD       Current Outpatient Prescriptions  Medication Sig Dispense Refill  . Cetirizine HCl (ZYRTEC PO) Take 10 mg by mouth daily as needed (for allergies). Reported on 07/14/2015    . Cholecalciferol (VITAMIN D PO) Take by mouth daily.    Marland Kitchen lidocaine-prilocaine (EMLA) cream Apply 1 application topically as needed (for port access).     Marland Kitchen MAGNESIUM PO Take 1 tablet by mouth daily.     . Multiple Vitamin (MULTIVITAMIN) tablet Take 1 tablet by mouth daily.    . ondansetron (ZOFRAN) 4 MG tablet Take 4 mg by mouth every 8 (eight) hours as needed for nausea or vomiting.    Marland Kitchen Delta EVERY  3 weeks    . Probiotic Product (PROBIOTIC ADVANCED PO) Take 1 capsule by mouth daily.    Marland Kitchen pyridOXINE (VITAMIN B-6) 50 MG tablet Take 50 mg by mouth daily.    . sertraline (ZOLOFT) 50 MG tablet Take 50 mg by mouth daily.    Marland Kitchen sulfamethoxazole-trimethoprim (BACTRIM DS,SEPTRA DS) 800-160 MG per tablet Take 1 tablet by mouth 2 (two) times daily. (Patient not taking: Reported on 07/14/2015) 14 tablet 0     Results for orders placed  or performed during the hospital encounter of 10/30/15 (from the past 48 hour(s))  Lipase, blood     Status: None   Collection Time: 10/30/15  8:20 AM  Result Value Ref Range   Lipase 23 11 - 51 U/L  Comprehensive metabolic panel     Status: Abnormal   Collection Time: 10/30/15  8:20 AM  Result Value Ref Range   Sodium 136 135 - 145 mmol/L   Potassium 4.1 3.5 - 5.1 mmol/L   Chloride 101 101 - 111 mmol/L   CO2 26 22 - 32 mmol/L   Glucose, Bld 114 (H) 65 - 99 mg/dL   BUN 15 6 - 20 mg/dL   Creatinine, Ser 0.58 0.44 - 1.00 mg/dL   Calcium 10.1 8.9 - 10.3 mg/dL   Total Protein 6.9 6.5 - 8.1 g/dL   Albumin 3.4 (L) 3.5 - 5.0 g/dL   AST 27 15 - 41 U/L   ALT 22 14 - 54 U/L   Alkaline Phosphatase 56 38 - 126 U/L   Total Bilirubin 0.8 0.3 - 1.2 mg/dL   GFR  calc non Af Amer >60 >60 mL/min   GFR calc Af Amer >60 >60 mL/min    Comment: (NOTE) The eGFR has been calculated using the CKD EPI equation. This calculation has not been validated in all clinical situations. eGFR's persistently <60 mL/min signify possible Chronic Kidney Disease.    Anion gap 9 5 - 15  CBC     Status: Abnormal   Collection Time: 10/30/15  8:20 AM  Result Value Ref Range   WBC 6.1 4.0 - 10.5 K/uL   RBC 4.58 3.87 - 5.11 MIL/uL   Hemoglobin 14.2 12.0 - 15.0 g/dL   HCT 41.0 36.0 - 46.0 %   MCV 89.5 78.0 - 100.0 fL   MCH 31.0 26.0 - 34.0 pg   MCHC 34.6 30.0 - 36.0 g/dL   RDW 16.0 (H) 11.5 - 15.5 %   Platelets 390 150 - 400 K/uL  Urinalysis, Routine w reflex microscopic     Status: Abnormal   Collection Time: 10/30/15  9:40 AM  Result Value Ref Range   Color, Urine AMBER (A) YELLOW    Comment: BIOCHEMICALS MAY BE AFFECTED BY COLOR   APPearance CLEAR CLEAR   Specific Gravity, Urine 1.037 (H) 1.005 - 1.030   pH 6.5 5.0 - 8.0   Glucose, UA NEGATIVE NEGATIVE mg/dL   Hgb urine dipstick NEGATIVE NEGATIVE   Bilirubin Urine NEGATIVE NEGATIVE   Ketones, ur NEGATIVE NEGATIVE mg/dL   Protein, ur NEGATIVE NEGATIVE mg/dL   Nitrite NEGATIVE NEGATIVE   Leukocytes, UA NEGATIVE NEGATIVE    Comment: MICROSCOPIC NOT DONE ON URINES WITH NEGATIVE PROTEIN, BLOOD, LEUKOCYTES, NITRITE, OR GLUCOSE <1000 mg/dL.   Ct Abdomen Pelvis W Contrast  10/30/2015  CLINICAL DATA:  Generalized abdominal pain, nausea, vomiting. EXAM: CT ABDOMEN AND PELVIS WITH CONTRAST TECHNIQUE: Multidetector CT imaging of the abdomen and pelvis was performed using the standard protocol following bolus administration of intravenous contrast. CONTRAST:  146m ISOVUE-300 IOPAMIDOL (ISOVUE-300) INJECTION 61% COMPARISON:  CT scan of April 09, 2012. FINDINGS: Small left pleural effusion is noted. No significant osseous abnormality is noted. No gallstones are noted. The liver, spleen and pancreas are unremarkable. Adrenal  glands appear normal. Right renal cyst is noted. No hydronephrosis or renal obstruction is noted. Mild ascites is noted with drainage catheter entering right midaxillary line and tip in left lower quadrant of the abdomen. No colonic dilatation is noted. Severe small  bowel dilatation is noted which appears to be due to large periumbilical hernia with incarcerated small bowel loops. Urinary bladder appears normal. Large ovarian masses noted on prior exam are not well visualized currently and clinical correlation is recommended to determine if these have been surgically removed. Patient is status post hysterectomy. Abnormal soft tissue density is noted in the pelvis which may represent residual or recurrent neoplasm or malignancy. IMPRESSION: Mild ascites is noted with drainage catheter in place. No colonic dilatation is noted. Severe small bowel dilatation is noted which appears to be due to incarcerated small bowel loops within large periumbilical hernia. Alternatively, the obstruction may be due to abnormal soft tissue density seen posteriorly in the pelvis which may represent recurrent or residual malignancy. Large ovarian masses noted on prior exam are not well visualized currently and clinical correlation is recommended determine if they have been surgically removed. Electronically Signed   By: James  Green Jr, M.D.   On: 10/30/2015 09:52   Dg Abd Acute W/chest  10/30/2015  CLINICAL DATA:  Acute generalized abdominal pain. EXAM: DG ABDOMEN ACUTE W/ 1V CHEST COMPARISON:  Radiograph of April 02, 2012. FINDINGS: There is no evidence of dilated bowel loops or free intraperitoneal air. Surgical drain is seen in the pelvis. Phleboliths and surgical clips are noted in the pelvis. Heart size and mediastinal contours are within normal limits. Left internal jugular Port-A-Cath is noted with distal tip in expected position of the SVC. Stable rounded density seen over right lung base most consistent with nipple shadow.  Minimal left pleural effusion is noted. No other acute pulmonary disease is noted. IMPRESSION: No evidence bowel obstruction or ileus. Surgical drain is noted in the pelvis. Minimal left pleural effusion is noted. Electronically Signed   By: James  Green Jr, M.D.   On: 10/30/2015 08:53    Review of Systems  Constitutional: Negative for fever and chills.  Respiratory: Negative.   Cardiovascular: Negative.   Gastrointestinal: Positive for nausea, vomiting and abdominal pain. Negative for diarrhea, constipation, blood in stool and melena.  Genitourinary: Negative.   Musculoskeletal: Negative.     Blood pressure 127/72, pulse 83, temperature 97.8 F (36.6 C), temperature source Oral, resp. rate 18, height 5' 9" (1.753 m), weight 63.504 kg (140 lb), SpO2 100 %. Physical Exam  General: Alert, Thin somewhat chronically ill-appearing Caucasian female, in no distress Skin: Warm and dry without rash or infection. HEENT: No palpable masses or thyromegaly. Sclera nonicteric. Pupils equal round and reactive.  Lymph nodes: No cervical, supraclavicular, nodes palpable. Lungs: Breath sounds clear and equal without increased work of breathing Cardiovascular: Regular rate and rhythm without murmur.2+ right lower extremity edema.  Abdomen: mild distention. No discernible ascites.Generally nontender. Long midline incision. There is a 8-9 cm erythematous tender mass periumbilical consistent with an incarceratedincisional hernia Extremities: There is 2+ edema of the right lower extremity. No swelling left lower extremity. No chronic venous stasis changes. Neurologic: Alert and fully oriented. Follow-up process normal. No gross motor deficits  Assessment/Plan Incarcerated ventral incisional hernia containing small bowel with high-grade obstruction. Currently undergoing therapy for persistent or recurrent ovarian cancer. History of hysterectomy and debulking and colectomy. Current chemotherapy. 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  She will require emergency repair of her hernia and possibly a small bowel resection. I discussed this with the patient and her husband. We discussed the urgent need for surgery. We discussed potential options for repair of her hernia. Her situation is obviously complicated due to ongoing   chemotherapy and ascites and known carcinomatosis. We discussed that there are really no options to emergency repair. We discussed risks of anesthetic complications, bleeding, infection, anastomotic leak, wound healing issues and recurrent hernia. They understand and agree to proceed.  Edward Jolly, MD 10/30/2015, 11:01 AM

## 2015-10-30 NOTE — Anesthesia Postprocedure Evaluation (Signed)
Anesthesia Post Note  Patient: Anna Kennedy  Procedure(s) Performed: Procedure(s) (LRB):  REPAIR OF INCARCERATED VENTRAL HERNIA INSERTION OF MESH (N/A) INSERTION OF MESH  Patient location during evaluation: PACU Anesthesia Type: General Level of consciousness: awake and alert Pain management: pain level controlled Vital Signs Assessment: post-procedure vital signs reviewed and stable Respiratory status: spontaneous breathing, nonlabored ventilation, respiratory function stable and patient connected to nasal cannula oxygen Cardiovascular status: blood pressure returned to baseline and stable Postop Assessment: no signs of nausea or vomiting Anesthetic complications: no    Last Vitals:  Filed Vitals:   10/30/15 1530 10/30/15 1535  BP:  138/90  Pulse: 85 87  Temp: 36.9 C 36.9 C  Resp: 12 12    Last Pain:  Filed Vitals:   10/30/15 1539  PainSc: 6                  Sharisse Rantz,W. EDMOND

## 2015-10-30 NOTE — Transfer of Care (Signed)
Immediate Anesthesia Transfer of Care Note  Patient: Anna Kennedy  Procedure(s) Performed: Procedure(s):  REPAIR OF INCARCERATED VENTRAL HERNIA INSERTION OF MESH (N/A) INSERTION OF MESH  Patient Location: PACU  Anesthesia Type:General  Level of Consciousness:  sedated, patient cooperative and responds to stimulation  Airway & Oxygen Therapy:Patient Spontanous Breathing and Patient connected to face mask oxgen  Post-op Assessment:  Report given to PACU RN and Post -op Vital signs reviewed and stable  Post vital signs:  Reviewed and stable  Last Vitals:  Filed Vitals:   10/30/15 0655 10/30/15 1005  BP: 134/88 127/72  Pulse: 104 83  Temp: 36.6 C   Resp: 16 18    Complications: No apparent anesthesia complications

## 2015-10-31 ENCOUNTER — Ambulatory Visit: Payer: BLUE CROSS/BLUE SHIELD | Admitting: Physical Therapy

## 2015-10-31 ENCOUNTER — Encounter (HOSPITAL_COMMUNITY): Payer: Self-pay | Admitting: General Surgery

## 2015-10-31 DIAGNOSIS — I890 Lymphedema, not elsewhere classified: Secondary | ICD-10-CM | POA: Diagnosis not present

## 2015-10-31 LAB — BASIC METABOLIC PANEL
ANION GAP: 4 — AB (ref 5–15)
BUN: 10 mg/dL (ref 6–20)
CHLORIDE: 106 mmol/L (ref 101–111)
CO2: 27 mmol/L (ref 22–32)
Calcium: 9.1 mg/dL (ref 8.9–10.3)
Creatinine, Ser: 0.62 mg/dL (ref 0.44–1.00)
GFR calc Af Amer: >60 mL/min (ref >60)
GFR calc non Af Amer: >60 mL/min (ref >60)
Glucose, Bld: 99 mg/dL (ref 65–99)
POTASSIUM: 4.8 mmol/L (ref 3.5–5.1)
SODIUM: 137 mmol/L (ref 135–145)

## 2015-10-31 LAB — CBC
HEMATOCRIT: 38.9 % (ref 36.0–46.0)
HEMOGLOBIN: 12.4 g/dL (ref 12.0–15.0)
MCH: 30.2 pg (ref 26.0–34.0)
MCHC: 31.9 g/dL (ref 30.0–36.0)
MCV: 94.9 fL (ref 78.0–100.0)
Platelets: 313 10*3/uL (ref 150–400)
RBC: 4.1 MIL/uL (ref 3.87–5.11)
RDW: 16.2 % — AB (ref 11.5–15.5)
WBC: 4.7 10*3/uL (ref 4.0–10.5)

## 2015-10-31 NOTE — Care Management Note (Signed)
Case Management Note  Patient Details  Name: Anna Kennedy MRN: [id number] Date of Birth: 05-Sep-1950  Subjective/Objective: 65 y/o f admitted w/ventral hernia.s/p ventral hernia repair. From home.                   Action/Plan:d/c plan home.   Expected Discharge Date:                  Expected Discharge Plan:  Home/Self Care  In-House Referral:     Discharge planning Services  CM Consult  Post Acute Care Choice:    Choice offered to:     DME Arranged:    DME Agency:     HH Arranged:    HH Agency:     Status of Service:  In process, will continue to follow  Medicare Important Message Given:    Date Medicare IM Given:    Medicare IM give by:    Date Additional Medicare IM Given:    Additional Medicare Important Message give by:     If discussed at [place] of Stay Meetings, dates discussed:    Additional Comments:  Dessa Phi, RN 10/31/2015, 3:59 PM

## 2015-10-31 NOTE — Progress Notes (Signed)
  Progress Note: General Surgery Service   Subjective: Some pain issues with movement. No nausea or vomiting.  Objective: Vital signs in last 24 hours: Temp:  [97.2 F (36.2 C)-98.7 F (37.1 C)] 98.1 F (36.7 C) (06/19 [id number]) Pulse Rate:  [74-95] 86 (06/19 0613) Resp:  [10-20] 16 (06/19 0613) BP: (108-150)/(65-102) 139/72 mmHg (06/19 0613) SpO2:  [99 %-100 %] 100 % (06/19 0613) Weight:  [63.504 kg (140 lb)] 63.504 kg (140 lb) (06/18 1604)    Intake/Output from previous day: 06/18 0701 - 06/19 0700 In: 2760 [I.V.:2710; IV Piggyback:50] Out: [id number] [Urine:880; Drains:30; Blood:25] Intake/Output this shift: Total I/O In: -  Out: 250 [Urine:250]  Lungs: CTAB  Cardiovascular: RRR  Abd: soft, ATTP. Bandage in place dry. Drain with serosanguinous output  Extremities: no edema  Neuro: AOx4  Lab Results: CBC   Recent Labs  10/30/15 0820 10/31/15 0510  WBC 6.1 4.7  HGB 14.2 12.4  HCT 41.0 38.9  PLT 390 313   BMET  Recent Labs  10/30/15 0820 10/31/15 0510  NA 136 137  K 4.1 4.8  CL 101 106  CO2 26 27  GLUCOSE 114* 99  BUN 15 10  CREATININE 0.58 0.62  CALCIUM 10.1 9.1   PT/INR No results for input(s): LABPROT, INR in the last 72 hours. ABG No results for input(s): PHART, HCO3 in the last 72 hours.  Invalid input(s): PCO2, PO2  Studies/Results:  Anti-infectives: Anti-infectives    Start     Dose/Rate Route Frequency Ordered Stop   10/30/15 1100  cefOXitin (MEFOXIN) 2 g in dextrose 5 % 50 mL IVPB     2 g 100 mL/hr over 30 Minutes Intravenous NOW 10/30/15 1058 10/30/15 1220      Medications: Scheduled Meds: . enoxaparin (LOVENOX) injection  40 mg Subcutaneous Q24H  . famotidine (PEPCID) IV  20 mg Intravenous Q12H   Continuous Infusions: . dextrose 5 % and 0.9 % NaCl with KCl 20 mEq/L 100 mL/hr at 10/31/15 0617   PRN Meds:.morphine injection, ondansetron **OR** ondansetron (ZOFRAN) IV  Assessment/Plan: Patient Active Problem List   Diagnosis  Date Noted  . Incarcerated incisional hernia 10/30/2015  . POSTNASAL DRIP 08/23/2009  . ASTHMA 08/01/2009  . ARTHRITIS 08/01/2009  . SHORTNESS OF BREATH (SOB) 08/01/2009   s/p Procedure(s):  REPAIR OF INCARCERATED VENTRAL HERNIA INSERTION OF MESH INSERTION OF MESH 10/30/2015 -continue NGT -up to chair with assist -foley out -continue pain control   LOS: 1 day   Mickeal Skinner, MD Pg# [phone] [facility]

## 2015-11-01 MED ORDER — PHENOL 1.4 % MT LIQD
1.0000 | OROMUCOSAL | Status: DC | PRN
  Filled 2015-11-01: qty 177

## 2015-11-01 NOTE — Progress Notes (Signed)
  Progress Note: General Surgery Service   Subjective: Pain improved, no nausea, no flatus or BM  Objective: Vital signs in last 24 hours: Temp:  [97.8 F (36.6 C)-98.4 F (36.9 C)] 98.3 F (36.8 C) (06/20 0813) Pulse Rate:  [89-99] 96 (06/20 0813) Resp:  [16-21] 18 (06/20 0813) BP: (100-139)/(69-80) 125/69 mmHg (06/20 0813) SpO2:  [99 %-100 %] 100 % (06/20 0813)    Intake/Output from previous day: 06/19 0701 - 06/20 0700 In: 1907 [I.V.:1457; NG/GT:400] Out: 1425 [Urine:1425] Intake/Output this shift: Total I/O In: -  Out: 200 [Urine:200]  Lungs: CTAB  Cardiovascular: RRR  Abd: soft, ATTP periincisionally. Wound c/d/i no erythema. Jp with serosang output  Extremities: no edema  Neuro: AOx4  Lab Results: CBC   Recent Labs  10/30/15 0820 10/31/15 0510  WBC 6.1 4.7  HGB 14.2 12.4  HCT 41.0 38.9  PLT 390 313   BMET  Recent Labs  10/30/15 0820 10/31/15 0510  NA 136 137  K 4.1 4.8  CL 101 106  CO2 26 27  GLUCOSE 114* 99  BUN 15 10  CREATININE 0.58 0.62  CALCIUM 10.1 9.1   PT/INR No results for input(s): LABPROT, INR in the last 72 hours. ABG No results for input(s): PHART, HCO3 in the last 72 hours.  Invalid input(s): PCO2, PO2  Studies/Results:  Anti-infectives: Anti-infectives    Start     Dose/Rate Route Frequency Ordered Stop   10/30/15 1100  cefOXitin (MEFOXIN) 2 g in dextrose 5 % 50 mL IVPB     2 g 100 mL/hr over 30 Minutes Intravenous NOW 10/30/15 1058 10/30/15 1220      Medications: Scheduled Meds: . enoxaparin (LOVENOX) injection  40 mg Subcutaneous Q24H  . famotidine (PEPCID) IV  20 mg Intravenous Q12H   Continuous Infusions: . dextrose 5 % and 0.9 % NaCl with KCl 20 mEq/L 100 mL/hr at 11/01/15 0800   PRN Meds:.morphine injection, ondansetron **OR** ondansetron (ZOFRAN) IV  Assessment/Plan: Patient Active Problem List   Diagnosis Date Noted  . Incarcerated incisional hernia 10/30/2015  . POSTNASAL DRIP 08/23/2009  .  ASTHMA 08/01/2009  . ARTHRITIS 08/01/2009  . SHORTNESS OF BREATH (SOB) 08/01/2009   s/p Procedure(s):  REPAIR OF INCARCERATED VENTRAL HERNIA INSERTION OF MESH INSERTION OF MESH 10/30/2015 -NG output still biliious, will recheck in afternoon -continue NPO and NG to suction at this time -ambulate -pain control   LOS: 2 days   Mickeal Skinner, MD Pg# [phone] [facility]

## 2015-11-01 NOTE — Evaluation (Signed)
Physical Therapy Evaluation Patient Details Name: Anna Kennedy MRN: [id number] DOB: May 13, 1951 Today's Date: 11/01/2015   History of Present Illness  65 yo female s/p ventral hernia repair with mesh 6/18. Hx of ovarian cancer-undergoing chemo  Clinical Impression  On eval pt was Min guard assist for mobility-walked ~375 feet while holding on to IV pole. Pt tolerated activity well. Recommend daily ambulation with nursing supervision-at least 2x/day if possible. Do not anticipate any follow up PT needs.     Follow Up Recommendations No PT follow up;Supervision/Assistance - 24 hour    Equipment Recommendations  None recommended by PT    Recommendations for Other Services       Precautions / Restrictions Precautions Precaution Comments: NG tube, abdominal surgery Required Braces or Orthoses: Other Brace/Splint Other Brace/Splint: abdominal binder Restrictions Weight Bearing Restrictions: No      Mobility  Bed Mobility Overal bed mobility: Needs Assistance Bed Mobility: Rolling;Sidelying to Sit;Sit to Supine Rolling: Min guard Sidelying to sit: Min guard   Sit to supine: Min guard   General bed mobility comments: Increased time  Transfers Overall transfer level: Needs assistance Equipment used: Rolling walker (2 wheeled);None Transfers: Sit to/from Stand Sit to Stand: Min guard         General transfer comment: close guard for safety.   Ambulation/Gait Ambulation/Gait assistance: Min guard Ambulation Distance (Feet): 375 Feet         General Gait Details: close guard for safety. slow gait speed. Pt was able to transition from RW to IV pole ~halfway through ambulation distance  Stairs            Wheelchair Mobility    Modified Rankin (Stroke Patients Only)       Balance                                             Pertinent Vitals/Pain Pain Assessment: 0-10 Pain Score: 8  Pain Location: abdomen Pain Descriptors /  Indicators: Sharp;Sore Pain Intervention(s): Monitored during session;Repositioned    Home Living Family/patient expects to be discharged to:: Private residence Living Arrangements: Spouse/significant other   Type of Home: House Home Access: Stairs to enter   Technical brewer of Steps: 3 Home Layout: One level Home Equipment: Cane - single point      Prior Function Level of Independence: Independent               Hand Dominance        Extremity/Trunk Assessment   Upper Extremity Assessment: Overall WFL for tasks assessed           Lower Extremity Assessment: Generalized weakness      Cervical / Trunk Assessment: Normal  Communication   Communication: No difficulties  Cognition Arousal/Alertness: Awake/alert Behavior During Therapy: WFL for tasks assessed/performed Overall Cognitive Status: Within Functional Limits for tasks assessed                      General Comments      Exercises        Assessment/Plan    PT Assessment Patient needs continued PT services  PT Diagnosis Difficulty walking;Acute pain;Generalized weakness   PT Problem List Decreased mobility;Decreased activity tolerance;Pain  PT Treatment Interventions Gait training;Functional mobility training;Therapeutic activities;Patient/family education;Therapeutic exercise   PT Goals (Current goals can be found in the Care Plan section) Acute Rehab PT Goals  Patient Stated Goal: less pain PT Goal Formulation: With patient Time For Goal Achievement: 11/15/15 Potential to Achieve Goals: Good    Frequency Min 3X/week   Barriers to discharge        Co-evaluation               End of Session   Activity Tolerance: Patient tolerated treatment well Patient left: in bed;with call bell/phone within reach           Time: 1120-1143 PT Time Calculation (min) (ACUTE ONLY): 23 min   Charges:   PT Evaluation $PT Eval Low Complexity: 1 Procedure PT  Treatments $Gait Training: 8-22 mins   PT G Codes:        Weston Anna, MPT Pager: [phone]

## 2015-11-01 NOTE — Progress Notes (Signed)
2 Days Post-Op  Subjective: Cannister full, and she hasn't been walking yet, still on O2 at rest.  Chemo last week  Objective: Vital signs in last 24 hours: Temp:  [97.8 F (36.6 C)-98.4 F (36.9 C)] 98.3 F (36.8 C) (06/20 0813) Pulse Rate:  [89-99] 96 (06/20 0813) Resp:  [16-21] 18 (06/20 0813) BP: (100-139)/(69-80) 125/69 mmHg (06/20 0813) SpO2:  [99 %-100 %] 100 % (06/20 0813)   NPO NG? Urine 1425 Afebrile, VSS Labs OK yesterday  Intake/Output from previous day: 06/19 0701 - 06/20 0700 In: 1907 [I.V.:1457; NG/GT:400] Out: 1425 [Urine:1425] Intake/Output this shift: Total I/O In: -  Out: 200 [Urine:200]  General appearance: alert, cooperative, no distress and Resp: alert, cooperative,  IV, NG, and O2 are all connected to her [id number] tender, drain is serous, no BS, or flatus.  Incision looks fine. Lungs:  Few rales in the base   Lab Results:   Recent Labs  10/30/15 0820 10/31/15 0510  WBC 6.1 4.7  HGB 14.2 12.4  HCT 41.0 38.9  PLT 390 313    BMET  Recent Labs  10/30/15 0820 10/31/15 0510  NA 136 137  K 4.1 4.8  CL 101 106  CO2 26 27  GLUCOSE 114* 99  BUN 15 10  CREATININE 0.58 0.62  CALCIUM 10.1 9.1   PT/INR No results for input(s): LABPROT, INR in the last 72 hours.   Recent Labs Lab 10/30/15 0820  AST 27  ALT 22  ALKPHOS 56  BILITOT 0.8  PROT 6.9  ALBUMIN 3.4*     Lipase     Component Value Date/Time   LIPASE 23 10/30/2015 0820     Studies/Results: Ct Abdomen Pelvis W Contrast  10/30/2015  CLINICAL DATA:  Generalized abdominal pain, nausea, vomiting. EXAM: CT ABDOMEN AND PELVIS WITH CONTRAST TECHNIQUE: Multidetector CT imaging of the abdomen and pelvis was performed using the standard protocol following bolus administration of intravenous contrast. CONTRAST:  172mL ISOVUE-300 IOPAMIDOL (ISOVUE-300) INJECTION 61% COMPARISON:  CT scan of April 09, 2012. FINDINGS: Small left pleural effusion is noted. No significant osseous  abnormality is noted. No gallstones are noted. The liver, spleen and pancreas are unremarkable. Adrenal glands appear normal. Right renal cyst is noted. No hydronephrosis or renal obstruction is noted. Mild ascites is noted with drainage catheter entering right midaxillary line and tip in left lower quadrant of the abdomen. No colonic dilatation is noted. Severe small bowel dilatation is noted which appears to be due to large periumbilical hernia with incarcerated small bowel loops. Urinary bladder appears normal. Large ovarian masses noted on prior exam are not well visualized currently and clinical correlation is recommended to determine if these have been surgically removed. Patient is status post hysterectomy. Abnormal soft tissue density is noted in the pelvis which may represent residual or recurrent neoplasm or malignancy. IMPRESSION: Mild ascites is noted with drainage catheter in place. No colonic dilatation is noted. Severe small bowel dilatation is noted which appears to be due to incarcerated small bowel loops within large periumbilical hernia. Alternatively, the obstruction may be due to abnormal soft tissue density seen posteriorly in the pelvis which may represent recurrent or residual malignancy. Large ovarian masses noted on prior exam are not well visualized currently and clinical correlation is recommended determine if they have been surgically removed. Electronically Signed   By: Marijo Conception, M.D.   On: 10/30/2015 09:52   Dg Abd Acute W/chest  10/30/2015  CLINICAL DATA:  Acute generalized abdominal pain. EXAM:  DG ABDOMEN ACUTE W/ 1V CHEST COMPARISON:  Radiograph of April 02, 2012. FINDINGS: There is no evidence of dilated bowel loops or free intraperitoneal air. Surgical drain is seen in the pelvis. Phleboliths and surgical clips are noted in the pelvis. Heart size and mediastinal contours are within normal limits. Left internal jugular Port-A-Cath is noted with distal tip in expected  position of the SVC. Stable rounded density seen over right lung base most consistent with nipple shadow. Minimal left pleural effusion is noted. No other acute pulmonary disease is noted. IMPRESSION: No evidence bowel obstruction or ileus. Surgical drain is noted in the pelvis. Minimal left pleural effusion is noted. Electronically Signed   By: Marijo Conception, M.D.   On: 10/30/2015 08:53   Prior to Admission medications   Medication Sig Start Date End Date Taking? Authorizing Provider  Cetirizine HCl (ZYRTEC PO) Take 10 mg by mouth daily as needed (for allergies). Reported on 07/14/2015   Yes Historical Provider, MD  Cholecalciferol (VITAMIN D PO) Take by mouth daily.   Yes Historical Provider, MD  lidocaine-prilocaine (EMLA) cream Apply 1 application topically as needed (for port access).    Yes Historical Provider, MD  MAGNESIUM PO Take 1 tablet by mouth daily.    Yes Historical Provider, MD  Multiple Vitamin (MULTIVITAMIN) tablet Take 1 tablet by mouth daily.   Yes Historical Provider, MD  ondansetron (ZOFRAN) 4 MG tablet Take 4 mg by mouth every 8 (eight) hours as needed for nausea or vomiting.   Yes Historical Provider, MD  [place]  3 weeks   Yes Historical Provider, MD  Probiotic Product (PROBIOTIC ADVANCED PO) Take 1 capsule by mouth daily.   Yes Historical Provider, MD  pyridOXINE (VITAMIN B-6) 50 MG tablet Take 50 mg by mouth daily.   Yes Historical Provider, MD  sertraline (ZOLOFT) 50 MG tablet Take 50 mg by mouth daily.   Yes Historical Provider, MD  sulfamethoxazole-trimethoprim (BACTRIM DS,SEPTRA DS) 800-160 MG per tablet Take 1 tablet by mouth 2 (two) times daily. Patient not taking: Reported on 07/14/2015 12/27/14   Mercedes Camprubi-Soms, PA-C    Medications: . enoxaparin (LOVENOX) injection  40 mg Subcutaneous Q24H  . famotidine (PEPCID) IV  20 mg Intravenous Q12H   . dextrose 5 % and 0.9 % NaCl with KCl 20 mEq/L 100 mL/hr at 11/01/15  0800    Hx of asthma  Osteoarthritis both knees Hx of depression   Assessment/Plan Incarcerated ventral incisional hernia with small bowel obstruction S/p repair of incarcerated hernia with insertion of mesh, 10/30/15, Dr. Rolm Bookbinder Hx of ovarian cancer - ongoing chemotherapy at [facility], treated last week. Deconditioned secondary to chemo therapy. FEN:IV fluids/NPO x ice chips ID: pre op only DVT:  Lovenox/SCD  Plan:  Mobilize, continue NG for now.  Recheck labs in AM.        LOS: 2 days    Dublin Grayer 11/01/2015 [phone]

## 2015-11-02 LAB — CBC
HCT: 32.3 % — ABNORMAL LOW (ref 36.0–46.0)
Hemoglobin: 10.7 g/dL — ABNORMAL LOW (ref 12.0–15.0)
MCH: 31 pg (ref 26.0–34.0)
MCHC: 33.1 g/dL (ref 30.0–36.0)
MCV: 93.6 fL (ref 78.0–100.0)
PLATELETS: 261 10*3/uL (ref 150–400)
RBC: 3.45 MIL/uL — ABNORMAL LOW (ref 3.87–5.11)
RDW: 16.1 % — AB (ref 11.5–15.5)
WBC: 4.1 10*3/uL (ref 4.0–10.5)

## 2015-11-02 LAB — PREALBUMIN: Prealbumin: 6.4 mg/dL — ABNORMAL LOW (ref 18–38)

## 2015-11-02 LAB — BASIC METABOLIC PANEL
ANION GAP: 5 (ref 5–15)
BUN: 6 mg/dL (ref 6–20)
CALCIUM: 8.9 mg/dL (ref 8.9–10.3)
CO2: 26 mmol/L (ref 22–32)
CREATININE: 0.39 mg/dL — AB (ref 0.44–1.00)
Chloride: 104 mmol/L (ref 101–111)
GLUCOSE: 108 mg/dL — AB (ref 65–99)
Potassium: 3.9 mmol/L (ref 3.5–5.1)
Sodium: 135 mmol/L (ref 135–145)

## 2015-11-02 NOTE — Progress Notes (Signed)
Patient ID: Anna Kennedy, female   DOB: October 26, 1950, 65 y.o.   MRN: [id number]     [facility]      [address] [place], [place] [address]    Phone: [phone] FAX: [phone]     Subjective: Labs are stable. No flatus. Little mobilization. C/o sore throat. Afebrile.  Vss.  265m bilious output.   Objective:  Vital signs:  Filed Vitals:   11/01/15 1500 11/01/15 1900 11/01/15 2139 11/02/15 0641  BP: 113/74  109/72 109/73  Pulse: 88  91 90  Temp: 97.9 F (36.6 C)  98.7 F (37.1 C) 98.6 F (37 C)  TempSrc: Oral  Oral Oral  Resp: 18  16 16   Height:      Weight:      SpO2: 100% 98% 99% 97%       Intake/Output   Yesterday:  06/20 0701 - 06/21 0700 In: 16606[I.V.:800; NG/GT:250] Out: 2145 [Urine:1875; Emesis/NG output:250; Drains:20] This shift:  Total I/O In: -  Out: 250 [Emesis/NG output:250]   Physical Exam: General: Pt awake/alert/oriented x4 in no acute distress Chest: coarse CV:  Pulses intact.  Regular rhythm Abdomen: Soft.  Nondistended.  Mildly tender at incisions only. jp drain with serosanguinous output. No evidence of peritonitis.  No incarcerated hernias. Ext:  SCDs BLE.  No mjr edema.  No cyanosis Skin: No petechiae / purpura   Problem List:   Active Problems:   Incarcerated incisional hernia    Results:   Labs: Results for orders placed or performed during the hospital encounter of 10/30/15 (from the past 48 hour(s))  CBC     Status: Abnormal   Collection Time: 11/02/15  5:35 AM  Result Value Ref Range   WBC 4.1 4.0 - 10.5 K/uL   RBC 3.45 (L) 3.87 - 5.11 MIL/uL   Hemoglobin 10.7 (L) 12.0 - 15.0 g/dL   HCT 32.3 (L) 36.0 - 46.0 %   MCV 93.6 78.0 - 100.0 fL   MCH 31.0 26.0 - 34.0 pg   MCHC 33.1 30.0 - 36.0 g/dL   RDW 16.1 (H) 11.5 - 15.5 %   Platelets 261 150 - 400 K/uL  Basic metabolic panel     Status: Abnormal   Collection Time: 11/02/15  5:35 AM  Result Value Ref Range   Sodium 135 135 - 145 mmol/L   Potassium 3.9 3.5 - 5.1 mmol/L   Chloride 104 101 - 111 mmol/L   CO2 26 22 - 32 mmol/L   Glucose, Bld 108 (H) 65 - 99 mg/dL   BUN 6 6 - 20 mg/dL   Creatinine, Ser 0.39 (L) 0.44 - 1.00 mg/dL   Calcium 8.9 8.9 - 10.3 mg/dL   GFR calc non Af Amer >60 >60 mL/min   GFR calc Af Amer >60 >60 mL/min    Comment: (NOTE) The eGFR has been calculated using the CKD EPI equation. This calculation has not been validated in all clinical situations. eGFR's persistently <60 mL/min signify possible Chronic Kidney Disease.    Anion gap 5 5 - 15    Imaging / Studies: No results found.  Medications / Allergies:  Scheduled Meds: . enoxaparin (LOVENOX) injection  40 mg Subcutaneous Q24H  . famotidine (PEPCID) IV  20 mg Intravenous Q12H   Continuous Infusions: . dextrose 5 % and 0.9 % NaCl with KCl 20 mEq/L 100 mL/hr at 11/02/15 0325   PRN Meds:.morphine injection, ondansetron **OR** ondansetron (ZOFRAN) IV, phenol  Antibiotics: Anti-infectives  Start     Dose/Rate Route Frequency Ordered Stop   10/30/15 1100  cefOXitin (MEFOXIN) 2 g in dextrose 5 % 50 mL IVPB     2 g 100 mL/hr over 30 Minutes Intravenous NOW 10/30/15 1058 10/30/15 1220        Assessment/Plan Incarcerated ventral incisional hernia with SBO POD#3 repair of incarcerated ventral hernia with mesh---Dr. Excell Seltzer  -continue NGT, await bowel function -mobilize, IS -continue drain VTE prophylaxis-SCD, lovenox FEN-NPO, IVF Dispo-ileus   Erby Pian, ANP-BC [facility] Pager [phone](7A-4:30P)   11/02/2015 9:12 AM

## 2015-11-03 ENCOUNTER — Encounter (HOSPITAL_COMMUNITY): Payer: Self-pay | Admitting: *Deleted

## 2015-11-03 DIAGNOSIS — Z8719 Personal history of other diseases of the digestive system: Secondary | ICD-10-CM

## 2015-11-03 DIAGNOSIS — Z9889 Other specified postprocedural states: Secondary | ICD-10-CM

## 2015-11-03 MED ORDER — DOCUSATE SODIUM 100 MG PO CAPS
100.0000 mg | ORAL_CAPSULE | Freq: Two times a day (BID) | ORAL | Status: DC
  Administered 2015-11-03 – 2015-11-05 (×4): 100 mg via ORAL
  Filled 2015-11-03 (×8): qty 1

## 2015-11-03 MED ORDER — SERTRALINE HCL 50 MG PO TABS
50.0000 mg | ORAL_TABLET | Freq: Every day | ORAL | Status: DC
  Administered 2015-11-03 – 2015-11-05 (×3): 50 mg via ORAL
  Filled 2015-11-03 (×3): qty 1

## 2015-11-03 MED ORDER — OXYCODONE-ACETAMINOPHEN 5-325 MG PO TABS: 1.0000 | ORAL_TABLET | ORAL | Status: DC | PRN

## 2015-11-03 MED ORDER — POLYETHYLENE GLYCOL 3350 17 G PO PACK
17.0000 g | PACK | Freq: Every day | ORAL | Status: DC
Start: 2015-11-03 — End: 2015-11-05
  Administered 2015-11-03 – 2015-11-05 (×3): 17 g via ORAL
  Filled 2015-11-03 (×4): qty 1

## 2015-11-03 NOTE — Progress Notes (Signed)
PT Cancellation Note  Patient Details Name: Anna Kennedy MRN: [id number] DOB: 10-07-1950   Cancelled Treatment:    Reason Eval/Treat Not Completed: PT screened, no needs identified, will sign off. Pt denied any further need for PT. She has been walking in hallway with nursing.  Will sign off at pt's request.    Weston Anna, MPT Pager: [phone]

## 2015-11-03 NOTE — Progress Notes (Signed)
Patient ID: Anna Kennedy, female   DOB: 21-Feb-1951, 65 y.o.   MRN: [id number]     [facility]      [address] [place], [place] [address]    Phone: [phone] FAX: [phone]     Subjective: No n/v. ngt clamped. No flatus. Vss. Afebrile. No dysuria.  Ambulating.   Objective:  Vital signs:  Filed Vitals:   11/02/15 0641 11/02/15 1455 11/02/15 2130 11/03/15 0634  BP: 109/73 123/73 117/79 113/70  Pulse: 90  98 87  Temp: 98.6 F (37 C) 99 F (37.2 C) 98.9 F (37.2 C) 98.4 F (36.9 C)  TempSrc: Oral Oral Oral Oral  Resp: 16 16 17 17   Height:      Weight:      SpO2: 97% 100% 97% 98%       Intake/Output   Yesterday:  06/21 0701 - 06/22 0700 In: 0  Out: 1335 [Urine:975; Emesis/NG output:350; Drains:10] This shift:  Total I/O In: -  Out: 320 [Urine:320]   Physical Exam: General: Pt awake/alert/oriented x4 in no acute distress  Abdomen: Soft.  Nondistended.   Mildly tender at incisions only.  Incision is c/d/i, jp drain with serosanguinous output.  luq dressing is c/d/i. No evidence of peritonitis.  No incarcerated hernias.    Problem List:   Active Problems:   Incarcerated incisional hernia    Results:   Labs: Results for orders placed or performed during the hospital encounter of 10/30/15 (from the past 48 hour(s))  CBC     Status: Abnormal   Collection Time: 11/02/15  5:35 AM  Result Value Ref Range   WBC 4.1 4.0 - 10.5 K/uL   RBC 3.45 (L) 3.87 - 5.11 MIL/uL   Hemoglobin 10.7 (L) 12.0 - 15.0 g/dL   HCT 32.3 (L) 36.0 - 46.0 %   MCV 93.6 78.0 - 100.0 fL   MCH 31.0 26.0 - 34.0 pg   MCHC 33.1 30.0 - 36.0 g/dL   RDW 16.1 (H) 11.5 - 15.5 %   Platelets 261 150 - 400 K/uL  Basic metabolic panel     Status: Abnormal   Collection Time: 11/02/15  5:35 AM  Result Value Ref Range   Sodium 135 135 - 145 mmol/L   Potassium 3.9 3.5 - 5.1 mmol/L   Chloride 104 101 - 111 mmol/L   CO2 26 22 - 32 mmol/L   Glucose, Bld 108 (H) 65 - 99 mg/dL   BUN 6 6 - 20 mg/dL   Creatinine, Ser 0.39 (L) 0.44 - 1.00 mg/dL   Calcium 8.9 8.9 - 10.3 mg/dL   GFR calc non Af Amer >60 >60 mL/min   GFR calc Af Amer >60 >60 mL/min    Comment: (NOTE) The eGFR has been calculated using the CKD EPI equation. This calculation has not been validated in all clinical situations. eGFR's persistently <60 mL/min signify possible Chronic Kidney Disease.    Anion gap 5 5 - 15  Prealbumin     Status: Abnormal   Collection Time: 11/02/15  5:35 AM  Result Value Ref Range   Prealbumin 6.4 (L) 18 - 38 mg/dL    Comment: Performed at [facility]    Imaging / Studies: No results found.  Medications / Allergies:  Scheduled Meds: . enoxaparin (LOVENOX) injection  40 mg Subcutaneous Q24H  . famotidine (PEPCID) IV  20 mg Intravenous Q12H   Continuous Infusions: . dextrose 5 % and 0.9 % NaCl with  KCl 20 mEq/L 100 mL/hr at 11/03/15 1008   PRN Meds:.morphine injection, ondansetron **OR** ondansetron (ZOFRAN) IV, phenol  Antibiotics: Anti-infectives    Start     Dose/Rate Route Frequency Ordered Stop   10/30/15 1100  cefOXitin (MEFOXIN) 2 g in dextrose 5 % 50 mL IVPB     2 g 100 mL/hr over 30 Minutes Intravenous NOW 10/30/15 1058 10/30/15 1220       Assessment/Plan Incarcerated ventral incisional hernia with SBO POD#4 repair of incarcerated ventral hernia with mesh---Dr. Excell Seltzer  -dc ngt and allow for clears -mobilize, IS -continue drain and abdominal binder VTE prophylaxis-SCD, lovenox FEN-NPO, reduce IVF, add miralax, colace, po pain meds, resume zoloft Dispo-ileus     Erby Pian, ANP-BC [facility] Pager [phone](7A-4:30P)   11/03/2015 10:19 AM

## 2015-11-04 LAB — AEROBIC/ANAEROBIC CULTURE W GRAM STAIN (SURGICAL/DEEP WOUND): Gram Stain: NONE SEEN

## 2015-11-04 LAB — AEROBIC/ANAEROBIC CULTURE (SURGICAL/DEEP WOUND): CULTURE: NO GROWTH

## 2015-11-04 MED ORDER — SODIUM CHLORIDE 0.9% FLUSH
10.0000 mL | INTRAVENOUS | Status: DC | PRN
  Administered 2015-11-05: 10 mL
  Filled 2015-11-04: qty 40

## 2015-11-04 MED ORDER — POLYETHYLENE GLYCOL 3350 17 G PO PACK: 17.0000 g | PACK | Freq: Every day | ORAL | Status: AC

## 2015-11-04 MED ORDER — SODIUM CHLORIDE 0.9% FLUSH: 10.0000 mL | Freq: Two times a day (BID) | INTRAVENOUS | Status: DC

## 2015-11-04 MED ORDER — DOCUSATE SODIUM 100 MG PO CAPS: 100.0000 mg | ORAL_CAPSULE | Freq: Two times a day (BID) | ORAL | Status: AC

## 2015-11-04 MED ORDER — OXYCODONE-ACETAMINOPHEN 5-325 MG PO TABS: 1.0000 | ORAL_TABLET | ORAL | Status: AC | PRN

## 2015-11-04 NOTE — Discharge Summary (Signed)
Physician Discharge Summary  Anna Kennedy [id number] DOB: 1950/05/24 DOA: 10/30/2015  PCP: Marjorie Smolder, MD  Consultation: None  Admit date: 10/30/2015 Discharge date: 11/04/2015  Recommendations for Outpatient Follow-up:   Follow-up Information    Follow up with Edward Jolly, MD.   Specialty:  General Surgery   Why:  our office will call you next week to schedule a post operative check up   Contact information:   [place] [address] [phone]      Discharge Diagnoses:  1. Incarcerated ventral incisional hernia with SBO    Surgical Procedure: repair of incarcerated ventral hernia with mesh---Dr. Excell Seltzer   Discharge Condition: Stable  Disposition: Home  Diet recommendation: Regular diet  Filed Weights   10/30/15 0655 10/30/15 1604  Weight: 63.504 kg (140 lb) 63.504 kg (140 lb)     Filed Vitals:   11/03/15 2202 11/04/15 0601  BP: 125/69 116/66  Pulse: 97 79  Temp: 98.2 F (36.8 C) 97.6 F (36.4 C)  Resp: 18 17     Hospital Course:  Anna Kennedy has a history of serous ovarian cancer diagnosed in 2013 s/p debulking surgery followed by chemo and radiation, pleurX catheter placement on 6/15 at [facility] for ascites. She presented to [facility] ED with abdominal pain and vomiting. CT findings indicated incarcerated small bowel loops within large periumbilical hernia. Patient underwent a repair of incarcerated ventral hernia with mesh on 10/30/2015. Patient tolerated surgery without complications. Patient was transferred to the floor. Diet was advanced as ileus resolved. Patient was maintained on SCD and lovenox throughout hospitalization. Her home medications were resumed. Patient was mobilized. Blake drain with minimal output removed POD 5. On POD#5, patient was tolerating a diet, having bowel movements, ambulating, pain well controlled and afebrile. She was therefore felt stable for discharge.   We spoke with GYN/ONC Dr.  Fransisca Connors NP and updated on patient's condition. He advised me to let the patient know that if she has questions/concerns about PleurX or requires extra teaching, to alert their office. She did not require drainage during her hospitalization.      Physical Exam:   Gen: NAD, thin R: CTAB CV: RRR Ab: soft, NT, ND, wound c/d/i, LUQ drain in place   Discharge Instructions     Medication List    STOP taking these medications        sulfamethoxazole-trimethoprim 800-160 MG tablet  Commonly known as:  BACTRIM DS,SEPTRA DS      TAKE these medications        docusate sodium 100 MG capsule  Commonly known as:  COLACE  Take 1 capsule (100 mg total) by mouth 2 (two) times daily.     lidocaine-prilocaine cream  Commonly known as:  EMLA  Apply 1 application topically as needed (for port access).     MAGNESIUM PO  Take 1 tablet by mouth daily.     multivitamin tablet  Take 1 tablet by mouth daily.     ondansetron 4 MG tablet  Commonly known as:  ZOFRAN  Take 4 mg by mouth every 8 (eight) hours as needed for nausea or vomiting.     oxyCODONE-acetaminophen 5-325 MG tablet  Commonly known as:  PERCOCET/ROXICET  Take 1-2 tablets by mouth every 4 (four) hours as needed for moderate pain or severe pain.     polyethylene glycol packet  Commonly known as:  MIRALAX / GLYCOLAX  Take 17 g by mouth daily.     PRESCRIPTION MEDICATION  [place]  3 weeks     PROBIOTIC ADVANCED PO  Take 1 capsule by mouth daily.     pyridOXINE 50 MG tablet  Commonly known as:  VITAMIN B-6  Take 50 mg by mouth daily.     sertraline 50 MG tablet  Commonly known as:  ZOLOFT  Take 50 mg by mouth daily.     VITAMIN D PO  Take by mouth daily.     ZYRTEC PO  Take 10 mg by mouth daily as needed (for allergies). Reported on 07/14/2015           Follow-up Information    Follow up with Edward Jolly, MD.   Specialty:  General Surgery   Why:  our office will call you next  week to schedule a post operative check up   Contact information:   [place] [address] [phone]        The results of significant diagnostics from this hospitalization (including imaging, microbiology, ancillary and laboratory) are listed below for reference.    Significant Diagnostic Studies: Ct Abdomen Pelvis W Contrast  10/30/2015  CLINICAL DATA:  Generalized abdominal pain, nausea, vomiting. EXAM: CT ABDOMEN AND PELVIS WITH CONTRAST TECHNIQUE: Multidetector CT imaging of the abdomen and pelvis was performed using the standard protocol following bolus administration of intravenous contrast. CONTRAST:  141mL ISOVUE-300 IOPAMIDOL (ISOVUE-300) INJECTION 61% COMPARISON:  CT scan of April 09, 2012. FINDINGS: Small left pleural effusion is noted. No significant osseous abnormality is noted. No gallstones are noted. The liver, spleen and pancreas are unremarkable. Adrenal glands appear normal. Right renal cyst is noted. No hydronephrosis or renal obstruction is noted. Mild ascites is noted with drainage catheter entering right midaxillary line and tip in left lower quadrant of the abdomen. No colonic dilatation is noted. Severe small bowel dilatation is noted which appears to be due to large periumbilical hernia with incarcerated small bowel loops. Urinary bladder appears normal. Large ovarian masses noted on prior exam are not well visualized currently and clinical correlation is recommended to determine if these have been surgically removed. Patient is status post hysterectomy. Abnormal soft tissue density is noted in the pelvis which may represent residual or recurrent neoplasm or malignancy. IMPRESSION: Mild ascites is noted with drainage catheter in place. No colonic dilatation is noted. Severe small bowel dilatation is noted which appears to be due to incarcerated small bowel loops within large periumbilical hernia. Alternatively, the obstruction may be due to  abnormal soft tissue density seen posteriorly in the pelvis which may represent recurrent or residual malignancy. Large ovarian masses noted on prior exam are not well visualized currently and clinical correlation is recommended determine if they have been surgically removed. Electronically Signed   By: Marijo Conception, M.D.   On: 10/30/2015 09:52   US Venous Img Lower Unilateral Right  10/13/2015  CLINICAL DATA:  Right lower extremity pain and edema. History of ovarian carcinoma EXAM: RIGHT LOWER EXTREMITY VENOUS DUPLEX ULTRASOUND TECHNIQUE: Gray-scale sonography with graded compression, as well as color Doppler and duplex ultrasound were performed to evaluate the right lower extremity deep venous system from the level of the common femoral vein and including the common femoral, femoral, profunda femoral, popliteal and calf veins including the posterior tibial, peroneal and gastrocnemius veins when visible. The superficial great saphenous vein was also interrogated. Spectral Doppler was utilized to evaluate flow at rest and with distal augmentation maneuvers in the common femoral, femoral and  popliteal veins. COMPARISON:  None. FINDINGS: Common Femoral Vein: No evidence of thrombus. Normal compressibility, respiratory phasicity and response to augmentation. Saphenofemoral Junction: No evidence of thrombus. Normal compressibility and flow on color Doppler imaging. Profunda Femoral Vein: No evidence of thrombus. Normal compressibility and flow on color Doppler imaging. Femoral Vein: No evidence of thrombus. Normal compressibility, respiratory phasicity and response to augmentation. Popliteal Vein: No evidence of thrombus. Normal compressibility, respiratory phasicity and response to augmentation. Calf Veins: No evidence of thrombus. Normal compressibility and flow on color Doppler imaging. Superficial Great Saphenous Vein: No evidence of thrombus. Normal compressibility and flow on color Doppler imaging. Venous  Reflux:  None. Other Findings: No thrombus is noted in venous structures in the anterior foot region. IMPRESSION: No evidence of right lower extremity deep venous thrombosis. Electronically Signed   By: Lowella Grip III M.D.   On: 10/13/2015 16:00   Dg Abd Acute W/chest  10/30/2015  CLINICAL DATA:  Acute generalized abdominal pain. EXAM: DG ABDOMEN ACUTE W/ 1V CHEST COMPARISON:  Radiograph of April 02, 2012. FINDINGS: There is no evidence of dilated bowel loops or free intraperitoneal air. Surgical drain is seen in the pelvis. Phleboliths and surgical clips are noted in the pelvis. Heart size and mediastinal contours are within normal limits. Left internal jugular Port-A-Cath is noted with distal tip in expected position of the SVC. Stable rounded density seen over right lung base most consistent with nipple shadow. Minimal left pleural effusion is noted. No other acute pulmonary disease is noted. IMPRESSION: No evidence bowel obstruction or ileus. Surgical drain is noted in the pelvis. Minimal left pleural effusion is noted. Electronically Signed   By: Marijo Conception, M.D.   On: 10/30/2015 08:53    Microbiology: Recent Results (from the past 240 hour(s))  Aerobic/Anaerobic Culture (surgical/deep wound)     Status: None   Collection Time: 10/30/15 12:30 PM  Result Value Ref Range Status   Specimen Description PERITONEAL  Final   Special Requests NONE  Final   Gram Stain   Final    NO WBC SEEN NO ORGANISMS SEEN GRAM STAIN REVIEWED-AGREE WITH RESULT V WILKINS    Culture   Final    No growth aerobically or anaerobically. Performed at [facility]    Report Status 11/04/2015 FINAL  Final     Labs: Basic Metabolic Panel:  Recent Labs Lab 10/30/15 0820 10/31/15 0510 11/02/15 0535  NA 136 137 135  K 4.1 4.8 3.9  CL 101 106 104  CO2 26 27 26   GLUCOSE 114* 99 108*  BUN 15 10 6   CREATININE 0.58 0.62 0.39*  CALCIUM 10.1 9.1 8.9   Liver Function Tests:  Recent  Labs Lab 10/30/15 0820  AST 27  ALT 22  ALKPHOS 56  BILITOT 0.8  PROT 6.9  ALBUMIN 3.4*    Recent Labs Lab 10/30/15 0820  LIPASE 23   No results for input(s): AMMONIA in the last 168 hours. CBC:  Recent Labs Lab 10/30/15 0820 10/31/15 0510 11/02/15 0535  WBC 6.1 4.7 4.1  HGB 14.2 12.4 10.7*  HCT 41.0 38.9 32.3*  MCV 89.5 94.9 93.6  PLT 390 313 261   Cardiac Enzymes: No results for input(s): CKTOTAL, CKMB, CKMBINDEX, TROPONINI in the last 168 hours. BNP: BNP (last 3 results) No results for input(s): BNP in the last 8760 hours.  ProBNP (last 3 results) No results for input(s): PROBNP in the last 8760 hours.  CBG: No results for input(s): GLUCAP in the  last 168 hours.  Active Problems:   Incarcerated incisional hernia   S/P repair of ventral hernia w/ mesh 6/181/7   Time coordinating discharge: <30 mins  Signed:  Emina Riebock, ANP-BC

## 2015-11-04 NOTE — Progress Notes (Signed)
  Progress Note: General Surgery Service   Subjective: Tolerating clears well, +flatus, +BM, no nausea, ambulated in thehall with assist  Objective: Vital signs in last 24 hours: Temp:  [97.6 F (36.4 C)-98.2 F (36.8 C)] 97.6 F (36.4 C) (06/23 0601) Pulse Rate:  [79-97] 79 (06/23 0601) Resp:  [17-18] 17 (06/23 0601) BP: (116-125)/(66-69) 116/66 mmHg (06/23 0601) SpO2:  [97 %] 97 % (06/23 0601) Last BM Date: 11/03/15  Intake/Output from previous day: 06/22 0701 - 06/23 0700 In: 1660.8 [P.O.:760; I.V.:850.8; IV Piggyback:50] Out: 1980 [id number]; Drains:10] Intake/Output this shift: Total I/O In: -  Out: 200 [Urine:200]  Lungs: CTAB  Cardiovascular: RRR  Abd: soft, ATTP, wound c/d/i, Jp with serosang output  Extremities: trace edema  Neuro: AOx4  Lab Results: CBC   Recent Labs  11/02/15 0535  WBC 4.1  HGB 10.7*  HCT 32.3*  PLT 261   BMET  Recent Labs  11/02/15 0535  NA 135  K 3.9  CL 104  CO2 26  GLUCOSE 108*  BUN 6  CREATININE 0.39*  CALCIUM 8.9   PT/INR No results for input(s): LABPROT, INR in the last 72 hours. ABG No results for input(s): PHART, HCO3 in the last 72 hours.  Invalid input(s): PCO2, PO2  Studies/Results:  Anti-infectives: Anti-infectives    Start     Dose/Rate Route Frequency Ordered Stop   10/30/15 1100  cefOXitin (MEFOXIN) 2 g in dextrose 5 % 50 mL IVPB     2 g 100 mL/hr over 30 Minutes Intravenous NOW 10/30/15 1058 10/30/15 1220      Medications: Scheduled Meds: . docusate sodium  100 mg Oral BID  . enoxaparin (LOVENOX) injection  40 mg Subcutaneous Q24H  . polyethylene glycol  17 g Oral Daily  . sertraline  50 mg Oral Daily   Continuous Infusions: . dextrose 5 % and 0.9 % NaCl with KCl 20 mEq/L 50 mL/hr at 11/04/15 0741   PRN Meds:.morphine injection, ondansetron **OR** ondansetron (ZOFRAN) IV, oxyCODONE-acetaminophen, phenol  Assessment/Plan: Patient Active Problem List   Diagnosis Date Noted  . S/P  repair of ventral hernia w/ mesh 6/181/7 11/03/2015  . Incarcerated incisional hernia 10/30/2015  . POSTNASAL DRIP 08/23/2009  . ASTHMA 08/01/2009  . ARTHRITIS 08/01/2009  . SHORTNESS OF BREATH (SOB) 08/01/2009   s/p Procedure(s):  REPAIR OF INCARCERATED VENTRAL HERNIA INSERTION OF MESH INSERTION OF MESH 10/30/2015 -advance to soft diet -continue to ambulate -will attempt contact with [facility] team -likely dc over the weekend   LOS: 5 days   Mickeal Skinner, MD Pg# [phone] [facility]

## 2015-11-04 NOTE — Discharge Instructions (Signed)
[facility] _______[facility]  UMBILICAL OR INGUINAL HERNIA REPAIR: POST OP INSTRUCTIONS  Always review your discharge instruction sheet given to you by the facility where your surgery was performed. IF YOU HAVE DISABILITY OR FAMILY LEAVE FORMS, YOU MUST BRING THEM TO THE OFFICE FOR PROCESSING.   DO NOT GIVE THEM TO YOUR DOCTOR.  1. A  prescription for pain medication may be given to you upon discharge.  Take your pain medication as prescribed, if needed.  If narcotic pain medicine is not needed, then you may take acetaminophen (Tylenol) or ibuprofen (Advil) as needed. 2. Take your usually prescribed medications unless otherwise directed. 3. If you need a refill on your pain medication, please contact your pharmacy.  They will contact our office to request authorization. Prescriptions will not be filled after 5 pm or on week-ends. 4. You should follow a light diet the first 24 hours after arrival home, such as soup and crackers, etc.  Be sure to include lots of fluids daily.  Resume your normal diet the day after surgery. 5. Most patients will experience some swelling and bruising around the umbilicus or in the groin and scrotum.  Ice packs and reclining will help.  Swelling and bruising can take several days to resolve.  6. It is common to experience some constipation if taking pain medication after surgery.  Increasing fluid intake and taking a stool softener (such as Colace) will usually help or prevent this problem from occurring.  A mild laxative (Milk of Magnesia or Miralax) should be taken according to package directions if there are no bowel movements after 48 hours. 7. Unless discharge instructions indicate otherwise, you may remove your bandages 24-48 hours after surgery, and you may shower at that time.  You may have steri-strips (small skin tapes) in place directly over the incision.  These strips should be left on the skin for 7-10 days.  If your surgeon used skin glue on the  incision, you may shower in 24 hours.  The glue will flake off over the next 2-3 weeks.  Any sutures or staples will be removed at the office during your follow-up visit. 8. ACTIVITIES:  You may resume regular (light) daily activities beginning the next day--such as daily self-care, walking, climbing stairs--gradually increasing activities as tolerated.  You may have sexual intercourse when it is comfortable.  Refrain from any heavy lifting or straining until approved by your doctor. a. You may drive when you are no longer taking prescription pain medication, you can comfortably wear a seatbelt, and you can safely maneuver your car and apply brakes. b. RETURN TO WORK:  __________________________________________________________ 9. You should see your doctor in the office for a follow-up appointment approximately 2-3 weeks after your surgery.  Make sure that you call for this appointment within a day or two after you arrive home to insure a convenient appointment time. 10. OTHER INSTRUCTIONS:  __________________________________________________________________________________________________________________________________________________________________________________________  WHEN TO CALL YOUR DOCTOR: 1. Fever over 101.0 2. Inability to urinate 3. Nausea and/or vomiting 4. Extreme swelling or bruising 5. Continued bleeding from incision. 6. Increased pain, redness, or drainage from the incision  The clinic staff is available to answer your questions during regular business hours.  Please don't hesitate to call and ask to speak to one of the nurses for clinical concerns.  If you have a medical emergency, go to the nearest emergency room or call 911.  A surgeon from [facility] is always on call at the hospital     [address], [address], [place], [place]  [address] ?  [address] [address], [place], [place]   [address] [phone] ? [phone] ? FAX [phone] Web site:  [email or web address]  

## 2015-11-05 MED ORDER — HEPARIN SOD (PORK) LOCK FLUSH 100 UNIT/ML IV SOLN
500.0000 [IU] | INTRAVENOUS | Status: AC | PRN
  Administered 2015-11-05: 500 [IU]

## 2015-11-05 MED ORDER — BISACODYL 10 MG RE SUPP
10.0000 mg | Freq: Once | RECTAL | Status: AC
  Administered 2015-11-05: 10 mg via RECTAL
  Filled 2015-11-05: qty 1

## 2015-11-05 NOTE — Progress Notes (Signed)
Obtained order from Dr. Kieth Brightly to drain pleurex once prior to patient discharge.  Oncology nurse at bedside.

## 2015-11-05 NOTE — Progress Notes (Signed)
R side pleurx drained according to MD order.  500 ml of clear dark yellow fluid obtained.  Pt denied pain throughout procedure and after procedure.  Step by step instructions given on how to drain.  Pt verbalized understanding of process and asked appropriate questions.  Drainage site looked unremarkable.  Pt educated on when to call MD related to drain/tubing/signs & symptoms of infections.  Instructed pt to call MD office for frequency of draining at home.  Pt stated she would watch educational video again at home.

## 2015-11-05 NOTE — Progress Notes (Signed)
Patient discharged.  Leaving with prescription and personal belongings.  Room air.  Denies pain.  Reports having a bowel movement today.  Accompanied by husband.  No s/s of distress.  Reports understanding of discharge instructions.  No complaints.

## 2015-11-08 ENCOUNTER — Ambulatory Visit: Payer: BLUE CROSS/BLUE SHIELD | Admitting: Physical Therapy

## 2015-11-22 ENCOUNTER — Ambulatory Visit: Payer: BLUE CROSS/BLUE SHIELD | Attending: Family Medicine | Admitting: Physical Therapy

## 2015-11-22 ENCOUNTER — Encounter (HOSPITAL_COMMUNITY): Payer: Self-pay

## 2015-11-22 DIAGNOSIS — I890 Lymphedema, not elsewhere classified: Secondary | ICD-10-CM | POA: Diagnosis present

## 2015-11-22 NOTE — Therapy (Signed)
[place], [place], [address] Phone: [phone]   Fax:  [phone]  Physical Therapy Treatment  Patient Details  Name: Anna Kennedy MRN: [id number] Date of Birth: 12/13/1950 Referring Provider: Darcus Austin  Encounter Date: 11/22/2015      PT End of Session - 11/22/15 1611    Visit Number 31   Number of Visits 40   Date for PT Re-Evaluation 11/09/15   Authorization Type April cert completed   PT Start Time 1516   PT Stop Time 1608   PT Time Calculation (min) 52 min   Activity Tolerance Patient tolerated treatment well   Behavior During Therapy [facility] for tasks assessed/performed      Past Medical History  Diagnosis Date  . Retinal tear     treated previously with laser surgery  . Atypical chest pain   . Other social stressor   . Osteoarthritis of both knees   . History of depression   . History of asthma   . Cancer ([place])     ovarian    Past Surgical History  Procedure Laterality Date  . Knee surgery      Of her knees  . Foot surgery      Right foot  . Laparotomy N/A 10/30/2015    Procedure:  REPAIR OF INCARCERATED VENTRAL HERNIA INSERTION OF MESH;  Surgeon: Excell Seltzer, MD;  Location: [facility];  Service: General;  Laterality: N/A;  . Insertion of mesh  10/30/2015    Procedure: INSERTION OF MESH;  Surgeon: Excell Seltzer, MD;  Location: [facility];  Service: General;;    There were no vitals filed for this visit.      Subjective Assessment - 11/22/15 1521    Subjective June 18th I had to go in for emergency surgery because my intestines began coming in to my hernias and I had to have surgery to correct it. Three days before that they had put in a drain for my abdominal fluid. I have not had to drain my abdominal fluid recently. I haven't been able to wear my compression hose because I had 18 staples. My legs were not swelling much after the abdominal fluid was removed.  I feel I   am filling back  up with some fluid in my abdomen because my legs are starting to swell. I wear my CircAid at night and I use my pump everyday.    Pertinent History Was diagnosed Nov 25,2013 with ovarian cancer. Pt underwent a total hysterectomy in Dec 2013 at that time no lymph nodes were involved. Pt is recieving chemotherapy infusions but did not require radiation. Now one lymph node is involved in right groin and pt has edema of  bilateral LEs with right worse than left. They initially thought it was cellulitis and she was hospitilized but later pt learned it was lymphedema. Pt has never had a DVT.   How long can you sit comfortably? approx 45 min to an hour, pt tries to get up every 20 minutes or so at work   Patient Stated Goals to get LE swelling under control and get some relief   Currently in Pain? No/denies   Pain Score 0-No pain               LYMPHEDEMA/ONCOLOGY QUESTIONNAIRE - 11/22/15 1535    Right Lower Extremity Lymphedema   30 cm Proximal to Suprapatella 57 cm   20 cm Proximal to Suprapatella 49.5 cm   10 cm  Proximal to Suprapatella 46.5 cm   At Midpatella/Popliteal Crease 42 cm   30 cm Proximal to Floor at Lateral Plantar Foot 35.6 cm   20 cm Proximal to Floor at Lateral Plantar Foot 27.5 1   10  cm Proximal to Floor at Lateral Malleoli 22 cm   5 cm Proximal to 1st MTP Joint 21.5 cm   Around Proximal Great Toe 8.2 cm                  [facility] Adult PT Treatment/Exercise - 11/22/15 0001    Manual Therapy   Edema Management circumference measurements taken.   Manual Lymphatic Drainage (MLD) Manual lymph drainage:  abdominal sequence skipped due to recent hernia repair, short neck, right axilla and inguino-axillary anastomosis, and right LE from dorsal foot to lateral thigh in supine moving fluid towards anastomosis                   Short Term Clinic Goals - 11/22/15 1531    CC Short Term Goal  #1   Title Pt to demonstrate a 3 cm decrease of right  thigh edema (20 cm above suprapatella) to decrease risk of cellulitis   Baseline 51.6 cm; 54.5 cm 07/20/15 but the bandage had slid beneath this point and pt had great reductions below this causing increase fluid above, 3//16: 51; 08/03/15 50.4 cm; 52.8 08/24/15 so resumed Rt LE bandaging today, 09/09/15-50.9, 09/19/15- 50.3, 09/29/15- 52.6 cm , 53.5 cm 10/12/15, 11/22/15 -49.5 cm   Status Not Met   CC Short Term Goal  #2   Title Pt to demonstrate a 2 cm decrease of right calf edema (30cm proximal to floor) to decrease risk of cellulitis   Status Achieved             Long Term Clinic Goals - 11/22/15 1534    CC Long Term Goal  #1   Title Pt to demonstrate a 6 cm decrease of right thigh edema (20 cm above suprapatella) to decrease risk of cellulitis   Baseline 51.6 cm, 53.5 cm 10/12/15, 11/22/15- 49.5 cm    Status Not Met   CC Long Term Goal  #2   Title Pt to demonstrate a 4 cm decrease of right calf edema (30cm proximal to floor) to decrease risk of cellulitis   Baseline 38, 32 cm on 07/28/2015, 37.3 cm 10/12/15   Status Achieved   CC Long Term Goal  #3   Title Pt to be receive with appropriate compression garments for day and night time use for long term management of edema   Baseline patient has received compression stockings for day time use, pt to be measured for night time garment, pt to receive trial of FlexiTouch compression pump, 09/09/15- pt to receive compression pump on Monday, she has a nighttime garment for RLE, circular knit compression thigh highs for BLE and was measured today for flat knit compression thigh highs for better containment, 09/19/15- pt has compression pump and is awaiting arrival of flat knit thigh highs, 09/29/15- Pt has received flat knit thigh high compression stockings and they are managing edema better than circular knit   Status Achieved   CC Long Term Goal  #4   Title Pt to verbalize signs/symptoms of infection in bilateral LEs and lymphedema precautions   Status  Achieved            Plan - 11/22/15 1611    Clinical Impression Statement Pt has had recent surgery to repair her abdominal hernia.  She also had a drain placed for management of abdominal edema. She no longer has edema in LLE and her RLE has decreased significantly since last session. She is able to control her edema through use of compression pump and compression garments. The reduction of abdominal fluid seems to have help relieve her LE edema. Pt will be discharged from skilled PT services at this time.    Clinical Impairments Affecting Rehab Potential pt goes for chemotherapy infusions   PT Frequency 1x / week   PT Duration 4 weeks   PT Treatment/Interventions Vasopneumatic Device;Manual lymph drainage;Compression bandaging;Therapeutic exercise   PT Next Visit Plan dc this visit   PT Home Exercise Plan use compression pump daily on RLE   Consulted and Agree with Plan of Care Patient      Patient will benefit from skilled therapeutic intervention in order to improve the following deficits and impairments:     Visit Diagnosis: Lymphedema, not elsewhere classified     Problem List Patient Active Problem List   Diagnosis Date Noted  . S/P repair of ventral hernia w/ mesh 6/181/7 11/03/2015  . Incarcerated incisional hernia 10/30/2015  . POSTNASAL DRIP 08/23/2009  . ASTHMA 08/01/2009  . ARTHRITIS 08/01/2009  . SHORTNESS OF BREATH (SOB) 08/01/2009    Alexia Freestone 11/22/2015, 4:13 PM  [place], [place], [address] Phone: [phone]   Fax:  [phone]  Name: PAULLA MCCLASKEY MRN: [id number] Date of Birth: [birth year]    Allyson Sabal, PT 11/22/2015 4:13 PM

## 2016-01-11 ENCOUNTER — Emergency Department (HOSPITAL_COMMUNITY): Payer: BLUE CROSS/BLUE SHIELD

## 2016-01-11 ENCOUNTER — Emergency Department (HOSPITAL_COMMUNITY)
Admission: EM | Admit: 2016-01-11 | Discharge: 2016-01-12 | Disposition: A | Discharge status: Home / Self Care | Payer: BLUE CROSS/BLUE SHIELD | Attending: Emergency Medicine | Admitting: Emergency Medicine

## 2016-01-11 DIAGNOSIS — Z8543 Personal history of malignant neoplasm of ovary: Secondary | ICD-10-CM | POA: Insufficient documentation

## 2016-01-11 DIAGNOSIS — Z79899 Other long term (current) drug therapy: Secondary | ICD-10-CM | POA: Diagnosis not present

## 2016-01-11 DIAGNOSIS — R112 Nausea with vomiting, unspecified: Secondary | ICD-10-CM | POA: Insufficient documentation

## 2016-01-11 DIAGNOSIS — J45909 Unspecified asthma, uncomplicated: Secondary | ICD-10-CM | POA: Insufficient documentation

## 2016-01-11 DIAGNOSIS — R197 Diarrhea, unspecified: Secondary | ICD-10-CM | POA: Diagnosis not present

## 2016-01-11 DIAGNOSIS — R109 Unspecified abdominal pain: Secondary | ICD-10-CM | POA: Insufficient documentation

## 2016-01-11 LAB — COMPREHENSIVE METABOLIC PANEL
ALT: 25 U/L (ref 14–54)
AST: 23 U/L (ref 15–41)
Albumin: 3.4 g/dL — ABNORMAL LOW (ref 3.5–5.0)
Alkaline Phosphatase: 79 U/L (ref 38–126)
Anion gap: 9 (ref 5–15)
BUN: 12 mg/dL (ref 6–20)
CHLORIDE: 102 mmol/L (ref 101–111)
CO2: 26 mmol/L (ref 22–32)
Calcium: 9.7 mg/dL (ref 8.9–10.3)
Creatinine, Ser: 0.59 mg/dL (ref 0.44–1.00)
Glucose, Bld: 106 mg/dL — ABNORMAL HIGH (ref 65–99)
POTASSIUM: 3.3 mmol/L — AB (ref 3.5–5.1)
SODIUM: 137 mmol/L (ref 135–145)
Total Bilirubin: 0.8 mg/dL (ref 0.3–1.2)
Total Protein: 7.3 g/dL (ref 6.5–8.1)

## 2016-01-11 LAB — CBC
HEMATOCRIT: 35.1 % — AB (ref 36.0–46.0)
Hemoglobin: 11.5 g/dL — ABNORMAL LOW (ref 12.0–15.0)
MCH: 31.9 pg (ref 26.0–34.0)
MCHC: 32.8 g/dL (ref 30.0–36.0)
MCV: 97.2 fL (ref 78.0–100.0)
Platelets: 189 10*3/uL (ref 150–400)
RBC: 3.61 MIL/uL — AB (ref 3.87–5.11)
RDW: 17.4 % — ABNORMAL HIGH (ref 11.5–15.5)
WBC: 3.4 10*3/uL — AB (ref 4.0–10.5)

## 2016-01-11 LAB — URINE MICROSCOPIC-ADD ON

## 2016-01-11 LAB — URINALYSIS, ROUTINE W REFLEX MICROSCOPIC
GLUCOSE, UA: NEGATIVE mg/dL
HGB URINE DIPSTICK: NEGATIVE
Ketones, ur: NEGATIVE mg/dL
Nitrite: NEGATIVE
PH: 6 (ref 5.0–8.0)
Protein, ur: 30 mg/dL — AB
SPECIFIC GRAVITY, URINE: 1.035 — AB (ref 1.005–1.030)

## 2016-01-11 LAB — LIPASE, BLOOD: LIPASE: 25 U/L (ref 11–51)

## 2016-01-11 LAB — I-STAT CG4 LACTIC ACID, ED: LACTIC ACID, VENOUS: 1.07 mmol/L (ref 0.5–1.9)

## 2016-01-11 MED ORDER — IOPAMIDOL (ISOVUE-300) INJECTION 61%
100.0000 mL | Freq: Once | INTRAVENOUS | Status: AC | PRN
  Administered 2016-01-12: 100 mL via INTRAVENOUS

## 2016-01-11 MED ORDER — SODIUM CHLORIDE 0.9 % IV BOLUS (SEPSIS)
1000.0000 mL | Freq: Once | INTRAVENOUS | Status: AC
  Administered 2016-01-11: 1000 mL via INTRAVENOUS

## 2016-01-11 MED ORDER — ONDANSETRON HCL 4 MG/2ML IJ SOLN
4.0000 mg | Freq: Once | INTRAMUSCULAR | Status: AC
  Administered 2016-01-11: 4 mg via INTRAVENOUS
  Filled 2016-01-11: qty 2

## 2016-01-11 MED ORDER — DIATRIZOATE MEGLUMINE & SODIUM 66-10 % PO SOLN: 15.0000 mL | Freq: Once | ORAL | Status: DC

## 2016-01-11 NOTE — ED Provider Notes (Signed)
[facility] DEPT Provider Note   CSN: [id number] Arrival date & time: 01/11/16  1945 By signing my name below, I, Dyke Brackett, attest that this documentation has been prepared under the direction and in the presence of non-physician practitioner, Antonietta Breach, PA-C  Electronically Signed: Dyke Brackett, Scribe. 01/11/2016. 8:34 PM.   History   Chief Complaint Chief Complaint  Patient presents with  . Cancer  . Emesis    HPI Anna Kennedy is a 65 y.o. female with ovarian cancer who presents to the Emergency Department complaining of intermittent, worsening vomiting which began last week. Pt notes associated nausea, diarrhea, suprapubic cramping which she describes as tightnes, and abdominal distention. She states symptoms are worse after eating heavy meals; no alleviating factors noted. Per pt, she had a CT scan and was told she had a few "kinks" forming in her bowels. Most recent CT scan was one week ago and does not show a complete blockage. Her last chemo infusion was 12/23/15. She states that she is typically more nauseated after chemo, but current symptoms feel different to her.She last drained 1/3 L from PleurX system; pt reports drainage has decreased significantly. Pt denies hematochezia, melena, or fevers.   The history is provided by the patient. No language interpreter was used.    Past Medical History:  Diagnosis Date  . Atypical chest pain   . Cancer ([place])    ovarian  . History of asthma   . History of depression   . Osteoarthritis of both knees   . Other social stressor   . Retinal tear    treated previously with laser surgery    Patient Active Problem List   Diagnosis Date Noted  . S/P repair of ventral hernia w/ mesh 6/181/7 11/03/2015  . Incarcerated incisional hernia 10/30/2015  . POSTNASAL DRIP 08/23/2009  . ASTHMA 08/01/2009  . ARTHRITIS 08/01/2009  . SHORTNESS OF BREATH (SOB) 08/01/2009    Past Surgical History:  Procedure Laterality Date  . FOOT  SURGERY     Right foot  . INSERTION OF MESH  10/30/2015   Procedure: INSERTION OF MESH;  Surgeon: Excell Seltzer, MD;  Location: [facility];  Service: General;;  . KNEE SURGERY     Of her knees  . LAPAROTOMY N/A 10/30/2015   Procedure:  REPAIR OF INCARCERATED VENTRAL HERNIA INSERTION OF MESH;  Surgeon: Excell Seltzer, MD;  Location: [facility];  Service: General;  Laterality: N/A;   OB History    No data available     Home Medications    Prior to Admission medications   Medication Sig Start Date End Date Taking? Authorizing Provider  Cholecalciferol (VITAMIN D PO) Take by mouth daily.   Yes Historical Provider, MD  lidocaine-prilocaine (EMLA) cream Apply 1 application topically as needed (for port access).    Yes Historical Provider, MD  MAGNESIUM PO Take 1 tablet by mouth daily.    Yes Historical Provider, MD  Multiple Vitamin (MULTIVITAMIN) tablet Take 1 tablet by mouth daily.   Yes Historical Provider, MD  ondansetron (ZOFRAN) 4 MG tablet Take 4 mg by mouth every 8 (eight) hours as needed for nausea or vomiting.   Yes Historical Provider, MD  Probiotic Product (PROBIOTIC ADVANCED PO) Take 1 capsule by mouth daily.   Yes Historical Provider, MD  prochlorperazine (COMPAZINE) 10 MG tablet Take 10 mg by mouth every 6 (six) hours as needed for nausea or vomiting.  12/26/15  Yes Historical Provider, MD  pyridOXINE (VITAMIN B-6) 50 MG tablet Take  50 mg by mouth daily.   Yes Historical Provider, MD  sertraline (ZOLOFT) 50 MG tablet Take 75 mg by mouth daily.    Yes Historical Provider, MD  docusate sodium (COLACE) 100 MG capsule Take 1 capsule (100 mg total) by mouth 2 (two) times daily. Patient not taking: Reported on 01/11/2016 11/04/15   Erby Pian, NP  oxyCODONE-acetaminophen (PERCOCET/ROXICET) 5-325 MG tablet Take 1-2 tablets by mouth every 4 (four) hours as needed for moderate pain or severe pain. Patient not taking: Reported on 01/11/2016 11/04/15   Erby Pian, NP  polyethylene glycol  (MIRALAX / GLYCOLAX) packet Take 17 g by mouth daily. Patient not taking: Reported on 01/11/2016 11/04/15   Erby Pian, NP  [place]  3 weeks    Historical Provider, MD    Family History Family History  Problem Relation Age of Onset  . Arthritis Mother   . Heart disease Father     Social History Social History  Substance Use Topics  . Smoking status: Never Smoker  . Smokeless tobacco: Never Used  . Alcohol use Yes    Allergies   Cat hair extract; Other; and Tegaderm ag mesh [silver] Review of Systems Review of Systems  Constitutional: Negative for fever.  Gastrointestinal: Positive for abdominal distention, abdominal pain, diarrhea, nausea and vomiting. Negative for blood in stool.  All other systems reviewed and are negative.   Physical Exam Updated Vital Signs BP 111/72 (BP Location: Right Arm)   Pulse 99   Temp 98.2 F (36.8 C) (Oral)   Resp 19   SpO2 98%   Physical Exam  Constitutional: She is oriented to person, place, and time. She appears well-developed and well-nourished. No distress.  Nontoxic appearing. Thin, mildly frail appearing.  HENT:  Head: Normocephalic and atraumatic.  Eyes: Conjunctivae and EOM are normal. No scleral icterus.  Neck: Normal range of motion.  Cardiovascular: Normal rate, regular rhythm and intact distal pulses.   Pulmonary/Chest: Effort normal. No respiratory distress. She has no wheezes.  Respirations even and unlabored  Abdominal: She exhibits distension (mild). There is no guarding.  Normoactive bowel sounds in all quadrants. Abdomen is mildly distended. No ascites. PleurX catheter without evidence of complications. No ascites. No focal TTP. No peritoneal signs.  Musculoskeletal: Normal range of motion.  Neurological: She is alert and oriented to person, place, and time.  GCS 15. Patient moving all extremities.  Skin: Skin is warm and dry. No rash noted. She is not diaphoretic. No  erythema. No pallor.  Psychiatric: She has a normal mood and affect. Her behavior is normal.  Nursing note and vitals reviewed.    ED Treatments / Results  DIAGNOSTIC STUDIES:  Oxygen Saturation is 99% on RA, normal by my interpretation.    COORDINATION OF CARE:  8:25 PM Discussed treatment plan with pt at bedside and pt agreed to plan.   Labs (all labs ordered are listed, but only abnormal results are displayed) Labs Reviewed  COMPREHENSIVE METABOLIC PANEL - Abnormal; Notable for the following:       Result Value   Potassium 3.3 (*)    Glucose, Bld 106 (*)    Albumin 3.4 (*)    All other components within normal limits  CBC - Abnormal; Notable for the following:    WBC 3.4 (*)    RBC 3.61 (*)    Hemoglobin 11.5 (*)    HCT 35.1 (*)    RDW 17.4 (*)    All other components  within normal limits  URINALYSIS, ROUTINE W REFLEX MICROSCOPIC (NOT AT [facility]) - Abnormal; Notable for the following:    Color, Urine AMBER (*)    APPearance CLOUDY (*)    Specific Gravity, Urine 1.035 (*)    Bilirubin Urine SMALL (*)    Protein, ur 30 (*)    Leukocytes, UA SMALL (*)    All other components within normal limits  URINE MICROSCOPIC-ADD ON - Abnormal; Notable for the following:    Squamous Epithelial / LPF 0-5 (*)    Bacteria, UA FEW (*)    Crystals CA OXALATE CRYSTALS (*)    All other components within normal limits  LIPASE, BLOOD  I-STAT CG4 LACTIC ACID, ED    EKG  EKG Interpretation None       Radiology Ct Abdomen Pelvis W Contrast  Result Date: 01/12/2016 CLINICAL DATA:  Cramping abdominal pain, nausea and vomiting for 1 week. EXAM: CT ABDOMEN AND PELVIS WITH CONTRAST TECHNIQUE: Multidetector CT imaging of the abdomen and pelvis was performed using the standard protocol following bolus administration of intravenous contrast. CONTRAST:  152mL ISOVUE-300 IOPAMIDOL (ISOVUE-300) INJECTION 61% COMPARISON:  10/30/2015 FINDINGS: Lower chest:  No significant abnormality.  Hepatobiliary: There are normal appearances of the liver, gallbladder and bile ducts. Pancreas: Normal Spleen: Normal Adrenals/Urinary Tract: The adrenals and kidneys are normal in appearance. There is no urinary calculus evident. There is no hydronephrosis or ureteral dilatation. Collecting systems and ureters appear unremarkable. There are unremarkable appearances of the urinary bladder. Stomach/Bowel: There is abnormal small bowel dilatation, mildly worsened from 10/30/2015. No transition point. Vascular/Lymphatic: The abdominal aorta is normal in caliber. There is no atherosclerotic calcification. There is no adenopathy in the abdomen or pelvis. Reproductive: Hysterectomy. Previously observed ovarian masses are no longer evident, probably due to oophorectomy. Other: Small to moderate volume ascites. Evidence of peritoneal carcinomatosis, with worsened thickening, nodularity and enhancement of the peritoneum. Peritoneal catheter appears unchanged in position. Umbilical hernia no longer evident. Musculoskeletal: No significant skeletal lesions are evident. IMPRESSION: 1. Abnormal thickening, nodularity and enhancement of the peritoneum. This is consistent with peritoneal carcinomatosis and is worsened from 10/30/2015. 2. Abnormal dilatation of small bowel, without transition point. Mildly worsened. 3. Small volume ascites. Unchanged position of the tunneled right lower quadrant peritoneal catheter. Electronically Signed   By: Andreas Newport M.D.   On: 01/12/2016 00:48    CT Chest with IV Contrast  CT Abdomen and Pelvis with IV Contrast  Comparison:09/21/2015 Impression:  1. Slightly worsened appearance of peritoneal metastatic disease with serosal involvement of small bowel likely contributing to areas of narrowing and stasis with worsening distention of loops of small bowel.   Electronically Reviewed [id number] Terance Hart, MD Electronically Reviewed on:12/31/2015 10:52 AM  I have reviewed the  images and concur with the above findings.  Electronically Signed [id number] Lyndel Safe, MD Electronically Signed on:12/31/2015 1:31 PM   Procedures Procedures (including critical care time)   Medications Ordered in ED Medications  ondansetron (ZOFRAN) injection 4 mg (not administered)  sodium chloride 0.9 % bolus 1,000 mL (1,000 mLs Intravenous New Bag/Given 01/11/16 2148)     Initial Impression / Assessment and Plan / ED Course  I have reviewed the triage vital signs and the nursing notes.  Pertinent labs & imaging results that were available during my care of the patient were reviewed by me and considered in my medical decision making (see chart for details).   Clinical Course    65 year old female with history of ovarian cancer with known metastases  to the peritoneum and small bowel presents to the emergency department today for concerns over frequent nausea and vomiting. Patient states that she has had issues with sporadic emesis over the past few weeks. She has continued to pass flatus and have bowel movements consistent with diarrhea. She states that she has been having diarrhea for months and attributes this to taking Smooth Move Tea.  Patient denies any recent fevers. She is afebrile today. Vital signs have been stable and laboratory workup is reassuring, consistent with prior evaluation in June 2017. CT from 12/25/2015 reviewed. This was performed at [facility]. CT findings show evidence of metastases to the small bowel. It appears that the cancer has caused some inflammation resulting in a narrowing of parts of the patient's intestine. This has resulted in secondary dilatation of small bowel loops without complete obstruction. It is likely the reason that the patient has been unable to tolerate many solid foods without nausea or vomiting. She reports tolerating protein shakes and liquids without much difficulty.  Patient did have an episode of emesis while in the emergency department  today. For this reason, a repeat CT scan was ordered to rule out acute obstruction. This plan was discussed with the patient's GYN/ONC physician, Dr. Drue Flirt, at [facility] at 10:15PM who expressed comfort with discharge if no evidence of acute obstruction on CT.   CT findings reviewed which appear consistent with CT from 2 weeks ago. Patient was able to tolerate oral contrast without difficulty. She has little complaints of nausea on repeat exam. Given stable imaging and laboratory findings, I believe the patient is stable for outpatient management and follow-up with her doctor tomorrow. Have advised that she takes Zofran for persistent nausea and vomiting. Have also recommended the use of a thickened liquid diet. Return precautions discussed and provided. Patient discharged in satisfactory condition with no unaddressed concerns.   Final Clinical Impressions(s) / ED Diagnoses   Final diagnoses:  Non-intractable vomiting with nausea, vomiting of unspecified type    I personally performed the services described in this documentation, which was scribed in my presence. The recorded information has been reviewed and is accurate.    New Prescriptions New Prescriptions   No medications on file     Antonietta Breach, PA-C 01/12/16 [place], PA-C 01/12/16 [place], MD 01/17/16 [phone]

## 2016-01-11 NOTE — ED Notes (Signed)
Port access and fluids running

## 2016-01-11 NOTE — ED Triage Notes (Signed)
Pt states that she had chemo 3 weeks ago and was told she had a few 'kinks' forming in her bowels. Pt states that she has had NVD for 1 week and has had cramping that feels better after vomiting. Alert and oriented.

## 2016-01-11 NOTE — ED Notes (Signed)
Pt has port to obtain labs from

## 2016-01-12 ENCOUNTER — Encounter (HOSPITAL_COMMUNITY): Payer: Self-pay

## 2016-01-12 MED ORDER — ONDANSETRON HCL 4 MG/2ML IJ SOLN
4.0000 mg | Freq: Once | INTRAMUSCULAR | Status: AC
  Administered 2016-01-12: 4 mg via INTRAVENOUS
  Filled 2016-01-12: qty 2

## 2016-01-12 MED ORDER — HEPARIN SOD (PORK) LOCK FLUSH 100 UNIT/ML IV SOLN
500.0000 [IU] | Freq: Once | INTRAVENOUS | Status: AC
  Administered 2016-01-12: 500 [IU]
  Filled 2016-01-12: qty 5

## 2016-01-12 NOTE — Discharge Instructions (Signed)
Continue taking Zofran for nausea and vomiting. You would benefit from a sick liquid diet. Try and avoid consumption of large amounts of solid foods as this will likely cause you to throw up. You would benefit from having things such as applesauce, yogurt, or protein shakes or smoothies. Follow-up with your gynecologic oncologist tomorrow. Return for any new or concerning symptoms.

## 2016-01-12 NOTE — ED Notes (Signed)
Provider in room  

## 2016-01-12 NOTE — ED Notes (Signed)
Pt in ct 

## 2016-01-13 ENCOUNTER — Encounter (HOSPITAL_COMMUNITY): Payer: Self-pay | Admitting: Emergency Medicine

## 2016-01-13 ENCOUNTER — Emergency Department (HOSPITAL_COMMUNITY)
Admission: EM | Admit: 2016-01-13 | Discharge: 2016-01-13 | Disposition: A | Discharge status: Home / Self Care | Payer: BLUE CROSS/BLUE SHIELD | Attending: Emergency Medicine | Admitting: Emergency Medicine

## 2016-01-13 DIAGNOSIS — Z452 Encounter for adjustment and management of vascular access device: Secondary | ICD-10-CM | POA: Insufficient documentation

## 2016-01-13 DIAGNOSIS — Z791 Long term (current) use of non-steroidal anti-inflammatories (NSAID): Secondary | ICD-10-CM | POA: Diagnosis not present

## 2016-01-13 DIAGNOSIS — J45909 Unspecified asthma, uncomplicated: Secondary | ICD-10-CM | POA: Insufficient documentation

## 2016-01-13 DIAGNOSIS — Z8543 Personal history of malignant neoplasm of ovary: Secondary | ICD-10-CM | POA: Diagnosis not present

## 2016-01-13 MED ORDER — HEPARIN SOD (PORK) LOCK FLUSH 100 UNIT/ML IV SOLN
500.0000 [IU] | Freq: Once | INTRAVENOUS | Status: AC
  Administered 2016-01-13: 500 [IU]
  Filled 2016-01-13: qty 5

## 2016-01-13 NOTE — ED Notes (Signed)
Pt port de-accessed.

## 2016-01-13 NOTE — ED Triage Notes (Signed)
Patient reports she is a cancer patient at [facility]. Went to [facility] today to receive chemo and was told no more chemo treatment were available. Patient left without having port de accessed. Denies other c/c.

## 2016-01-13 NOTE — ED Notes (Signed)
Bed: WTR8 Expected date:  Expected time:  Means of arrival:  Comments: 

## 2016-01-13 NOTE — ED Provider Notes (Signed)
[facility] DEPT Provider Note   CSN: [id number] Arrival date & time: 01/13/16  1735  By signing my name below, I, Anna Kennedy, attest that this documentation has been prepared under the direction and in the presence of non-physician practitioner, Okey Regal, PA-C. Electronically Signed: Jeanell Kennedy, Scribe. 01/13/2016. 6:37 PM.  History   Chief Complaint No chief complaint on file.  The history is provided by the patient. No language interpreter was used.   HPI Comments: Anna Kennedy is a 65 y.o. female with a hx of cancer who presents to the Emergency Department complaining of Vascular access port. Patient reports she is a cancer patient seen at [facility]. She went to have her chemotherapy today and was told that it is no longer working and they will Physical therapy using it. Patient left before doing her port discontinued. She denies any other complaints today requesting simply discontinuation report.  Past Medical History:  Diagnosis Date  . Atypical chest pain   . Cancer ([place])    ovarian  . History of asthma   . History of depression   . Osteoarthritis of both knees   . Other social stressor   . Retinal tear    treated previously with laser surgery    Patient Active Problem List   Diagnosis Date Noted  . S/P repair of ventral hernia w/ mesh 6/181/7 11/03/2015  . Incarcerated incisional hernia 10/30/2015  . POSTNASAL DRIP 08/23/2009  . ASTHMA 08/01/2009  . ARTHRITIS 08/01/2009  . SHORTNESS OF BREATH (SOB) 08/01/2009    Past Surgical History:  Procedure Laterality Date  . FOOT SURGERY     Right foot  . INSERTION OF MESH  10/30/2015   Procedure: INSERTION OF MESH;  Surgeon: Excell Seltzer, MD;  Location: [facility];  Service: General;;  . KNEE SURGERY     Of her knees  . LAPAROTOMY N/A 10/30/2015   Procedure:  REPAIR OF INCARCERATED VENTRAL HERNIA INSERTION OF MESH;  Surgeon: Excell Seltzer, MD;  Location: [facility];  Service: General;  Laterality: N/A;    OB  History    No data available       Home Medications    Prior to Admission medications   Medication Sig Start Date End Date Taking? Authorizing Provider  Cholecalciferol (VITAMIN D PO) Take by mouth daily.    Historical Provider, MD  docusate sodium (COLACE) 100 MG capsule Take 1 capsule (100 mg total) by mouth 2 (two) times daily. Patient not taking: Reported on 01/11/2016 11/04/15   Erby Pian, NP  lidocaine-prilocaine (EMLA) cream Apply 1 application topically as needed (for port access).     Historical Provider, MD  MAGNESIUM PO Take 1 tablet by mouth daily.     Historical Provider, MD  Multiple Vitamin (MULTIVITAMIN) tablet Take 1 tablet by mouth daily.    Historical Provider, MD  ondansetron (ZOFRAN) 4 MG tablet Take 4 mg by mouth every 8 (eight) hours as needed for nausea or vomiting.    Historical Provider, MD  oxyCODONE-acetaminophen (PERCOCET/ROXICET) 5-325 MG tablet Take 1-2 tablets by mouth every 4 (four) hours as needed for moderate pain or severe pain. Patient not taking: Reported on 01/11/2016 11/04/15   Erby Pian, NP  polyethylene glycol (MIRALAX / GLYCOLAX) packet Take 17 g by mouth daily. Patient not taking: Reported on 01/11/2016 11/04/15   Erby Pian, NP  [name]  3 weeks    Historical Provider, MD  Probiotic Product (PROBIOTIC ADVANCED PO) Take 1 capsule by  mouth daily.    Historical Provider, MD  prochlorperazine (COMPAZINE) 10 MG tablet Take 10 mg by mouth every 6 (six) hours as needed for nausea or vomiting.  12/26/15   Historical Provider, MD  pyridOXINE (VITAMIN B-6) 50 MG tablet Take 50 mg by mouth daily.    Historical Provider, MD  sertraline (ZOLOFT) 50 MG tablet Take 75 mg by mouth daily.     Historical Provider, MD    Family History Family History  Problem Relation Age of Onset  . Arthritis Mother   . Heart disease Father     Social History Social History  Substance Use Topics  . Smoking status: Never  Smoker  . Smokeless tobacco: Never Used  . Alcohol use Yes     Allergies   Cat hair extract; Other; and Tegaderm ag mesh [silver]   Review of Systems Review of Systems  All other systems reviewed and are negative.    Physical Exam Updated Vital Signs BP 117/74 (BP Location: Left Arm)   Pulse 94   Temp 98 F (36.7 C) (Oral)   Resp 14   SpO2 98%   Physical Exam  Constitutional: She appears well-developed and well-nourished. No distress.  HENT:  Head: Normocephalic and atraumatic.  Eyes: Conjunctivae are normal.  Neck: Neck supple.  Cardiovascular: Normal rate and regular rhythm.   Pulmonary/Chest: Effort normal.  Port with no abnormalities  Musculoskeletal: Normal range of motion.  Neurological: She is alert.  Skin: Skin is warm and dry.  Psychiatric: She has a normal mood and affect. Her behavior is normal.  Nursing note and vitals reviewed.    ED Treatments / Results  DIAGNOSTIC STUDIES: Oxygen Saturation is 98% on RA, normal by my interpretation.    COORDINATION OF CARE: 6:42 PM- Pt advised of plan for treatment and pt agrees.  Labs (all labs ordered are listed, but only abnormal results are displayed) Labs Reviewed - No data to display  EKG  EKG Interpretation None       Radiology Ct Abdomen Pelvis W Contrast  Result Date: 01/12/2016 CLINICAL DATA:  Cramping abdominal pain, nausea and vomiting for 1 week. EXAM: CT ABDOMEN AND PELVIS WITH CONTRAST TECHNIQUE: Multidetector CT imaging of the abdomen and pelvis was performed using the standard protocol following bolus administration of intravenous contrast. CONTRAST:  174mL ISOVUE-300 IOPAMIDOL (ISOVUE-300) INJECTION 61% COMPARISON:  10/30/2015 FINDINGS: Lower chest:  No significant abnormality. Hepatobiliary: There are normal appearances of the liver, gallbladder and bile ducts. Pancreas: Normal Spleen: Normal Adrenals/Urinary Tract: The adrenals and kidneys are normal in appearance. There is no urinary  calculus evident. There is no hydronephrosis or ureteral dilatation. Collecting systems and ureters appear unremarkable. There are unremarkable appearances of the urinary bladder. Stomach/Bowel: There is abnormal small bowel dilatation, mildly worsened from 10/30/2015. No transition point. Vascular/Lymphatic: The abdominal aorta is normal in caliber. There is no atherosclerotic calcification. There is no adenopathy in the abdomen or pelvis. Reproductive: Hysterectomy. Previously observed ovarian masses are no longer evident, probably due to oophorectomy. Other: Small to moderate volume ascites. Evidence of peritoneal carcinomatosis, with worsened thickening, nodularity and enhancement of the peritoneum. Peritoneal catheter appears unchanged in position. Umbilical hernia no longer evident. Musculoskeletal: No significant skeletal lesions are evident. IMPRESSION: 1. Abnormal thickening, nodularity and enhancement of the peritoneum. This is consistent with peritoneal carcinomatosis and is worsened from 10/30/2015. 2. Abnormal dilatation of small bowel, without transition point. Mildly worsened. 3. Small volume ascites. Unchanged position of the tunneled right lower quadrant peritoneal catheter.  Electronically Signed   By: Andreas Newport M.D.   On: 01/12/2016 00:48    Procedures Procedures (including critical care time)  Medications Ordered in ED Medications  heparin lock flush 100 unit/mL (500 Units Intracatheter Given 01/13/16 1819)     Initial Impression / Assessment and Plan / ED Course  I have reviewed the triage vital signs and the nursing notes.  Pertinent labs & imaging results that were available during my care of the patient were reviewed by me and considered in my medical decision making (see chart for details).  Clinical Course    Labs: None  Imaging: None  Consults:   Therapeutics: Heparin lock flush 100 unit/mL   Discharge Meds:  Assessment/Plan:   65 year old female  presents today to have her port accessed. No other complaints here discharged home.  Final Clinical Impressions(s) / ED Diagnoses   Final diagnoses:  Encounter for care related to vascular access port    New Prescriptions New Prescriptions   No medications on file   I personally performed the services described in this documentation, which was scribed in my presence. The recorded information has been reviewed and is accurate.    Okey Regal, PA-C 01/13/16 [place], MD 01/15/16 [phone]

## 2016-01-15 ENCOUNTER — Emergency Department (HOSPITAL_COMMUNITY): Payer: BLUE CROSS/BLUE SHIELD

## 2016-01-15 ENCOUNTER — Emergency Department (HOSPITAL_COMMUNITY)
Admission: EM | Admit: 2016-01-15 | Discharge: 2016-01-15 | Payer: BLUE CROSS/BLUE SHIELD | Attending: Emergency Medicine | Admitting: Emergency Medicine

## 2016-01-15 ENCOUNTER — Encounter (HOSPITAL_COMMUNITY): Payer: Self-pay | Admitting: Emergency Medicine

## 2016-01-15 DIAGNOSIS — Z79899 Other long term (current) drug therapy: Secondary | ICD-10-CM | POA: Insufficient documentation

## 2016-01-15 DIAGNOSIS — E860 Dehydration: Secondary | ICD-10-CM | POA: Diagnosis not present

## 2016-01-15 DIAGNOSIS — C569 Malignant neoplasm of unspecified ovary: Secondary | ICD-10-CM | POA: Diagnosis not present

## 2016-01-15 DIAGNOSIS — R531 Weakness: Secondary | ICD-10-CM

## 2016-01-15 DIAGNOSIS — J45909 Unspecified asthma, uncomplicated: Secondary | ICD-10-CM | POA: Insufficient documentation

## 2016-01-15 DIAGNOSIS — R109 Unspecified abdominal pain: Secondary | ICD-10-CM | POA: Diagnosis present

## 2016-01-15 LAB — URINALYSIS, ROUTINE W REFLEX MICROSCOPIC
Glucose, UA: NEGATIVE mg/dL
Hgb urine dipstick: NEGATIVE
Ketones, ur: 40 mg/dL — AB
Leukocytes, UA: NEGATIVE
Nitrite: NEGATIVE
Protein, ur: NEGATIVE mg/dL
Specific Gravity, Urine: 1.026 (ref 1.005–1.030)
pH: 6.5 (ref 5.0–8.0)

## 2016-01-15 LAB — COMPREHENSIVE METABOLIC PANEL
ALT: 25 U/L (ref 14–54)
AST: 31 U/L (ref 15–41)
Albumin: 3.1 g/dL — ABNORMAL LOW (ref 3.5–5.0)
Alkaline Phosphatase: 60 U/L (ref 38–126)
Anion gap: 12 (ref 5–15)
BUN: 11 mg/dL (ref 6–20)
CO2: 22 mmol/L (ref 22–32)
Calcium: 9.4 mg/dL (ref 8.9–10.3)
Chloride: 102 mmol/L (ref 101–111)
Creatinine, Ser: 0.65 mg/dL (ref 0.44–1.00)
GFR calc Af Amer: >60 mL/min (ref >60)
GFR calc non Af Amer: >60 mL/min (ref >60)
Glucose, Bld: 76 mg/dL (ref 65–99)
Potassium: 4.4 mmol/L (ref 3.5–5.1)
Sodium: 136 mmol/L (ref 135–145)
Total Bilirubin: 0.8 mg/dL (ref 0.3–1.2)
Total Protein: 6.5 g/dL (ref 6.5–8.1)

## 2016-01-15 LAB — CBC WITH DIFFERENTIAL/PLATELET
Basophils Absolute: 0 10*3/uL (ref 0.0–0.1)
Basophils Relative: 0 %
Eosinophils Absolute: 0 10*3/uL (ref 0.0–0.7)
Eosinophils Relative: 0 %
HCT: 35 % — ABNORMAL LOW (ref 36.0–46.0)
Hemoglobin: 11.7 g/dL — ABNORMAL LOW (ref 12.0–15.0)
Lymphocytes Relative: 16 %
Lymphs Abs: 0.4 10*3/uL — ABNORMAL LOW (ref 0.7–4.0)
MCH: 33.1 pg (ref 26.0–34.0)
MCHC: 33.4 g/dL (ref 30.0–36.0)
MCV: 98.9 fL (ref 78.0–100.0)
Monocytes Absolute: 0.6 10*3/uL (ref 0.1–1.0)
Monocytes Relative: 23 %
Neutro Abs: 1.5 10*3/uL — ABNORMAL LOW (ref 1.7–7.7)
Neutrophils Relative %: 61 %
Platelets: 177 10*3/uL (ref 150–400)
RBC: 3.54 MIL/uL — ABNORMAL LOW (ref 3.87–5.11)
RDW: 17.5 % — ABNORMAL HIGH (ref 11.5–15.5)
WBC: 2.4 10*3/uL — ABNORMAL LOW (ref 4.0–10.5)

## 2016-01-15 MED ORDER — IOPAMIDOL (ISOVUE-300) INJECTION 61%
100.0000 mL | Freq: Once | INTRAVENOUS | Status: AC | PRN
  Administered 2016-01-15: 100 mL via INTRAVENOUS

## 2016-01-15 MED ORDER — SODIUM CHLORIDE 0.9 % IV BOLUS (SEPSIS)
1000.0000 mL | Freq: Once | INTRAVENOUS | Status: AC
  Administered 2016-01-15: 1000 mL via INTRAVENOUS

## 2016-01-15 MED ORDER — ALTEPLASE 2 MG IJ SOLR
2.0000 mg | Freq: Once | INTRAMUSCULAR | Status: AC
  Administered 2016-01-15: 2 mg
  Filled 2016-01-15: qty 2

## 2016-01-15 MED ORDER — PROMETHAZINE HCL 25 MG/ML IJ SOLN
25.0000 mg | Freq: Once | INTRAMUSCULAR | Status: AC
  Administered 2016-01-15: 25 mg via INTRAVENOUS
  Filled 2016-01-15: qty 1

## 2016-01-15 MED ORDER — ONDANSETRON HCL 4 MG/2ML IJ SOLN
4.0000 mg | INTRAMUSCULAR | Status: DC | PRN
  Administered 2016-01-15: 4 mg via INTRAVENOUS
  Filled 2016-01-15: qty 2

## 2016-01-15 NOTE — ED Notes (Signed)
Accessed port but was able to only get small amt of blood return. Pt reports that tpa must be used when accessed

## 2016-01-15 NOTE — ED Notes (Signed)
[facility] unable to give ETA for transport.  Patient and family upset over delay. [facility] transport contacted and will make arrangements for transport.

## 2016-01-15 NOTE — ED Notes (Signed)
Pt has been assigned bed at [facility], [phone]. [facility] advises that transport will be available after shift change, 1900. Pt and family advised of room assignment as well as when transport is expected.

## 2016-01-15 NOTE — ED Provider Notes (Signed)
[facility] DEPT Provider Note   CSN: [id number] Arrival date & time: 01/15/16  0903     History   Chief Complaint Chief Complaint  Patient presents with  . Nausea  . Emesis  . Weakness    HPI Anna Kennedy is a 65 y.o. female.  HPI Patient presents to the emergency department with continued nausea, vomiting.  The patient was seen here in the emergency department on Wednesday.  She is followed up by her primary oncologist this past Friday.  She states that she got very upset by being told the news that they may not be a little offer any other chemotherapy treatments for her.  Patient states she came back because they forgot to the access her port at the visit because she was so upset.  She states that she continued to have nausea, vomiting yesterday and then into this morning.  She states today she feels weak.  Patient states that nothing seems to make the condition better or worse Past Medical History:  Diagnosis Date  . Atypical chest pain   . Cancer ([place])    ovarian  . History of asthma   . History of depression   . Osteoarthritis of both knees   . Other social stressor   . Retinal tear    treated previously with laser surgery    Patient Active Problem List   Diagnosis Date Noted  . S/P repair of ventral hernia w/ mesh 6/181/7 11/03/2015  . Incarcerated incisional hernia 10/30/2015  . POSTNASAL DRIP 08/23/2009  . ASTHMA 08/01/2009  . ARTHRITIS 08/01/2009  . SHORTNESS OF BREATH (SOB) 08/01/2009    Past Surgical History:  Procedure Laterality Date  . FOOT SURGERY     Right foot  . INSERTION OF MESH  10/30/2015   Procedure: INSERTION OF MESH;  Surgeon: Excell Seltzer, MD;  Location: [facility];  Service: General;;  . KNEE SURGERY     Of her knees  . LAPAROTOMY N/A 10/30/2015   Procedure:  REPAIR OF INCARCERATED VENTRAL HERNIA INSERTION OF MESH;  Surgeon: Excell Seltzer, MD;  Location: [facility];  Service: General;  Laterality: N/A;    OB History    No data  available       Home Medications    Prior to Admission medications   Medication Sig Start Date End Date Taking? Authorizing Provider  Cholecalciferol (VITAMIN D PO) Take 1,000 Units by mouth daily.    Yes Historical Provider, MD  lidocaine-prilocaine (EMLA) cream Apply 1 application topically as needed (for port access).    Yes Historical Provider, MD  MAGNESIUM PO Take 1 tablet by mouth daily.    Yes Historical Provider, MD  Multiple Vitamin (MULTIVITAMIN) tablet Take 1 tablet by mouth daily.   Yes Historical Provider, MD  ondansetron (ZOFRAN) 4 MG tablet Take 4 mg by mouth every 8 (eight) hours as needed for nausea or vomiting.   Yes Historical Provider, MD  [place]  3 weeks   Yes Historical Provider, MD  Probiotic Product (PROBIOTIC ADVANCED PO) Take 1 capsule by mouth daily.   Yes Historical Provider, MD  prochlorperazine (COMPAZINE) 10 MG tablet Take 10 mg by mouth every 6 (six) hours as needed for nausea or vomiting.  12/26/15  Yes Historical Provider, MD  pyridOXINE (VITAMIN B-6) 50 MG tablet Take 50 mg by mouth daily.   Yes Historical Provider, MD  sertraline (ZOLOFT) 50 MG tablet Take 75 mg by mouth daily.    Yes  Historical Provider, MD  docusate sodium (COLACE) 100 MG capsule Take 1 capsule (100 mg total) by mouth 2 (two) times daily. Patient not taking: Reported on 01/11/2016 11/04/15   Erby Pian, NP  oxyCODONE-acetaminophen (PERCOCET/ROXICET) 5-325 MG tablet Take 1-2 tablets by mouth every 4 (four) hours as needed for moderate pain or severe pain. Patient not taking: Reported on 01/11/2016 11/04/15   Erby Pian, NP  polyethylene glycol (MIRALAX / GLYCOLAX) packet Take 17 g by mouth daily. Patient not taking: Reported on 01/11/2016 11/04/15   Erby Pian, NP    Family History Family History  Problem Relation Age of Onset  . Arthritis Mother   . Heart disease Father     Social History Social History  Substance Use Topics  .  Smoking status: Never Smoker  . Smokeless tobacco: Never Used  . Alcohol use Yes     Allergies   Cat hair extract; Other; and Tegaderm ag mesh [silver]   Review of Systems Review of Systems All other systems negative except as documented in the HPI. All pertinent positives and negatives as reviewed in the HPI.  Physical Exam Updated Vital Signs BP 96/64 (BP Location: Right Arm)   Pulse 78   Temp 97.7 F (36.5 C) (Oral)   Resp 18   SpO2 100%   Physical Exam  Constitutional: She is oriented to person, place, and time. She appears well-developed and well-nourished. No distress.  HENT:  Head: Normocephalic and atraumatic.  Mouth/Throat: Oropharynx is clear and moist.  Eyes: Pupils are equal, round, and reactive to light.  Neck: Normal range of motion. Neck supple.  Cardiovascular: Normal rate, regular rhythm and normal heart sounds.  Exam reveals no gallop and no friction rub.   No murmur heard. Pulmonary/Chest: Effort normal and breath sounds normal. No respiratory distress. She has no wheezes.  Abdominal: Soft. Bowel sounds are normal. She exhibits mass. She exhibits no distension. There is no tenderness. There is no rebound and no guarding.  Neurological: She is alert and oriented to person, place, and time. She exhibits normal muscle tone. Coordination normal.  Skin: Skin is warm and dry. No rash noted. No erythema.  Psychiatric: She has a normal mood and affect. Her behavior is normal.  Nursing note and vitals reviewed.    ED Treatments / Results  Labs (all labs ordered are listed, but only abnormal results are displayed) Labs Reviewed  COMPREHENSIVE METABOLIC PANEL - Abnormal; Notable for the following:       Result Value   Albumin 3.1 (*)    All other components within normal limits  CBC WITH DIFFERENTIAL/PLATELET - Abnormal; Notable for the following:    WBC 2.4 (*)    RBC 3.54 (*)    Hemoglobin 11.7 (*)    HCT 35.0 (*)    RDW 17.5 (*)    Neutro Abs 1.5 (*)     Lymphs Abs 0.4 (*)    All other components within normal limits  URINALYSIS, ROUTINE W REFLEX MICROSCOPIC (NOT AT [facility]) - Abnormal; Notable for the following:    Bilirubin Urine MODERATE (*)    Ketones, ur 40 (*)    All other components within normal limits    EKG  EKG Interpretation None       Radiology Ct Abdomen Pelvis W Contrast  Result Date: 01/15/2016 CLINICAL DATA:  Abdominal pain, vomiting, ovarian cancer with peritoneal spread EXAM: CT ABDOMEN AND PELVIS WITH CONTRAST TECHNIQUE: Multidetector CT imaging of the abdomen and pelvis was performed using the  standard protocol following bolus administration of intravenous contrast. CONTRAST:  12mL ISOVUE-300 IOPAMIDOL (ISOVUE-300) INJECTION 61% COMPARISON:  01/12/2016 and 10/30/2015 FINDINGS: Lower chest:  Lung bases are clear. Hepatobiliary: Liver is within normal limits. Gallbladder is within normal limits. No intrahepatic or extrahepatic ductal dilatation. Pancreas: Within normal limits. Spleen: Within normal limits. Adrenals/Urinary Tract: Adrenal glands are within normal limits. 12 mm cyst along the anterior right upper kidney (series 2/ image 29). Left kidney is within normal limits. No hydronephrosis. Bladder is mildly thick-walled although underdistended. Stomach/Bowel: Stomach is grossly unremarkable. Stable dilated loops of proximal small bowel. Mildly dilated, matted loops of distal small bowel. Colon is not decompressed. While some degree of partial small bowel obstruction is possible, the overall appearance is unchanged when compared to June 2017. Status post left hemicolectomy with suture line in the lower pelvis (series 2/image 71). Vascular/Lymphatic: No evidence of abdominal aortic aneurysm. No suspicious abdominopelvic lymphadenopathy. Reproductive: Status post hysterectomy. Status post bilateral oophorectomy. Other: Moderate complex peritoneal ascites with peritoneal thickening and mild nodularity (for example, series 2/  images 13 and 33), corresponding to known peritoneal disease. Additional loculated ascites adjacent to the gastric cardia (series 2/ image 18). Peritoneal drainage catheter in the pelvis (series 2/ image 64). Musculoskeletal: Visualized osseous structures are within normal limits. Diffuse scattered broad cast IMPRESSION: Status post hysterectomy and bilateral oophorectomy. Moderate complex peritoneal ascites with nodularity, corresponding to known peritoneal disease. Stable dilated loops of proximal/mid small bowel, possibly reflecting partial small bowel obstruction, although unchanged since June 2017. Electronically Signed   By: Julian Hy M.D.   On: 01/15/2016 14:00   Dg Abdomen Acute W/chest  Result Date: 01/15/2016 CLINICAL DATA:  65 year old female with a history of nausea and vomiting for 3 days. EXAM: DG ABDOMEN ACUTE W/ 1V CHEST COMPARISON:  CT abdomen 01/12/2016, 10/30/2015 FINDINGS: Chest: Cardiomediastinal silhouette within normal limits. No evidence of central vascular congestion. Left chest wall port catheter via left IJ approach. Catheter appears to terminate superior vena cava. Coarsened interstitial markings of the bilateral lungs, similar to the comparison plain film. No pneumothorax pleural effusion or confluent airspace disease. Abdomen: Peritoneal dialysis catheter in position, with the external portion of coiled over the right abdomen in the internal portion within the low abdomen/ pelvis terminating in the left lower quadrant. Surgical clips project over the anatomic pelvis. Surgical suture line near the rectum. Small fecal burden. Gas within stomach, small bowel, and colon. No abnormal distended small bowel or colon. The upright image demonstrates multiple air-fluid levels. IMPRESSION: Chest: Chronic lung changes without evidence of superimposed acute cardiopulmonary disease. Left IJ port catheter. Abdomen: Multiple air-fluid levels on the upright image with relatively decompressed  colon, which may reflect persisting ileus or possible partial small bowel obstruction. Correlation with patient clinical presentation may be useful. Peritoneal dialysis catheter in place within the abdomen. Signed, Dulcy Fanny. Earleen Newport, DO [facility] Electronically Signed   By: Corrie Mckusick D.O.   On: 01/15/2016 12:45    Procedures Procedures (including critical care time)  Medications Ordered in ED Medications  sodium chloride 0.9 % bolus 1,000 mL (0 mLs Intravenous Stopped 01/15/16 1408)  promethazine (PHENERGAN) injection 25 mg (25 mg Intravenous Given 01/15/16 1108)  alteplase (CATHFLO ACTIVASE) injection 2 mg (2 mg Intracatheter Given 01/15/16 1127)  iopamidol (ISOVUE-300) 61 % injection 100 mL (100 mLs Intravenous Contrast Given 01/15/16 1328)  sodium chloride 0.9 % bolus 1,000 mL (1,000 mLs Intravenous New Bag/Given 01/15/16 1416)  Initial Impression / Assessment and Plan / ED Course  I have reviewed the triage vital signs and the nursing notes.  Pertinent labs & imaging results that were available during my care of the patient were reviewed by me and considered in my medical decision making (see chart for details).  Clinical Course    I spoke with Dr. Drue Flirt from [facility] system.  He states that the patient most likely needs several days of bowel rest and IV fluids.  He agrees to take the patient in transfer.  I spoke with the transfer center at [facility] and they will arrange a bed for the patient  Final Clinical Impressions(s) / ED Diagnoses   Final diagnoses:  Dehydration  Weakness    New Prescriptions New Prescriptions   No medications on file     Dalia Heading, PA-C 01/15/16 [place], MD 01/16/16 [phone]

## 2016-01-15 NOTE — ED Notes (Signed)
[facility] transport called back and ETA is ~1 hour

## 2016-01-15 NOTE — ED Triage Notes (Signed)
Patient here with complaints of nausea, vomiting. Report green emesis. Sees cancer md at [facility], ovarian cancer. States that she was seen for the same thing recently and had CT done that showed narrowing of the small intestine. Currently does not receive chemo treatments. Weakness due to not eating much.

## 2016-01-18 ENCOUNTER — Telehealth: Payer: Self-pay | Admitting: Gynecologic Oncology

## 2016-01-18 NOTE — Telephone Encounter (Signed)
Called to speak with patient about current status.  She is currently admitted at [facility] and states they are planning on placing a G tube tomorrow.  She is wanting to call me back to ask several questions but has the chaplain in her room at this time.

## 2016-01-19 ENCOUNTER — Telehealth: Payer: Self-pay | Admitting: Gynecologic Oncology

## 2016-01-19 NOTE — Telephone Encounter (Signed)
Returned call to patient.  Patient stating she is still admitted at [facility] with plans for a G tube placement today and NG tube as well.  She stated she attempted to take in clear liquids yesterday but threw everything up.  Asking about a palliative care doc in [place].  Referral at [facility] with Dr. C-P is scheduled for Sept 14 at 2:30pm.  Patient advised to call for any needs.

## 2016-01-19 NOTE — Telephone Encounter (Signed)
Patient had wanted recommendation for Palliative Care MD.  Called patient back with Lane Hacker, DO at [phone]

## 2016-03-14 DEATH — deceased

## 2017-05-11 IMAGING — CT CT ABD-PELV W/ CM
2 of 5 series · 16 of 46 positions shown, 18 images · IV contrast (iopamidol)
Comparison: CT scan of April 09, 2012.

CLINICAL DATA: Generalized abdominal pain, nausea, vomiting.

EXAM:
CT ABDOMEN AND PELVIS WITH CONTRAST
TECHNIQUE: Multidetector CT imaging of the abdomen and pelvis was performed
using the standard protocol following bolus administration of
intravenous contrast.
CONTRAST:  100mL HSAJ4W-ZDD IOPAMIDOL (HSAJ4W-ZDD) INJECTION 61%

[Series 2: abd/pel with · axial · 0.71mm/px · z∈[-473,-93]mm · 13 of 88 slices shown, 15 images]
[im 6/88  soft-tissue]
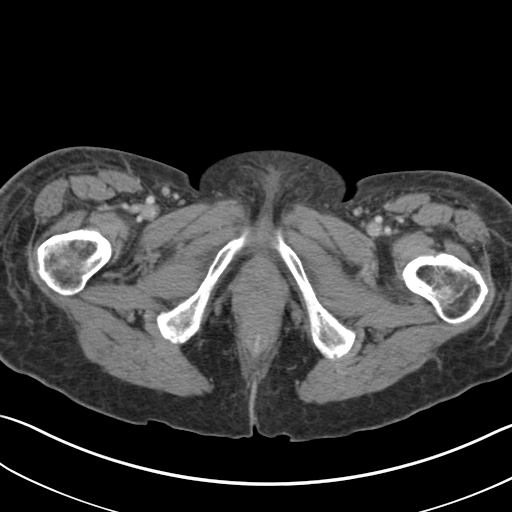
[im 6/88  bone]
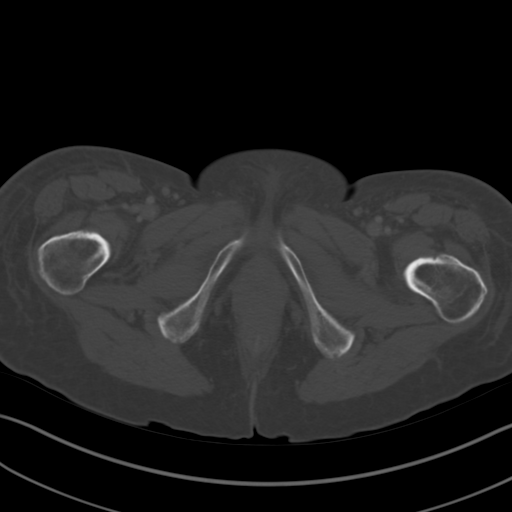
[im 12/88  soft-tissue]
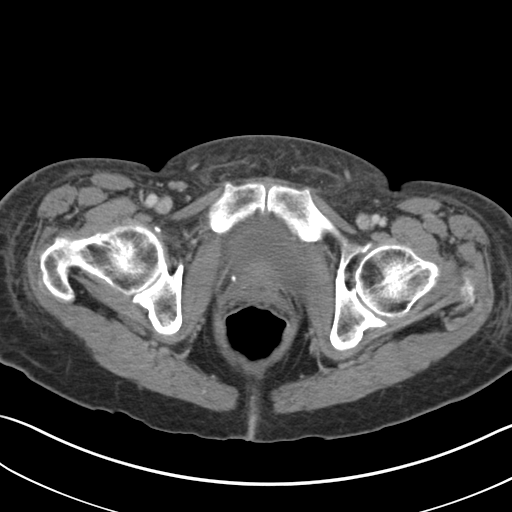
[im 18/88  soft-tissue]
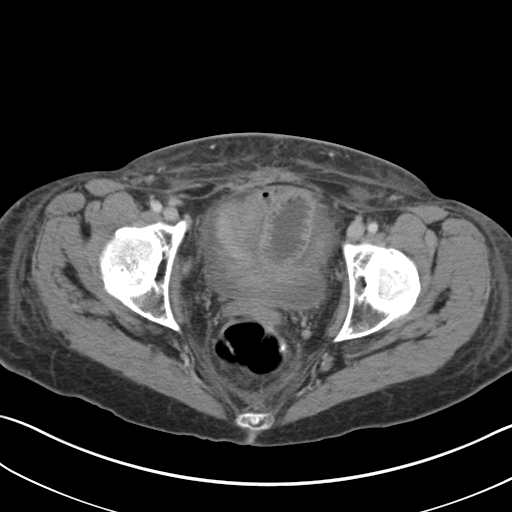
[im 24/88  soft-tissue]
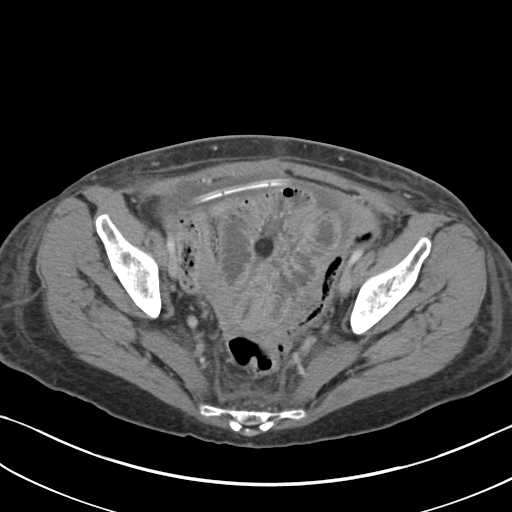
[im 30/88  soft-tissue]
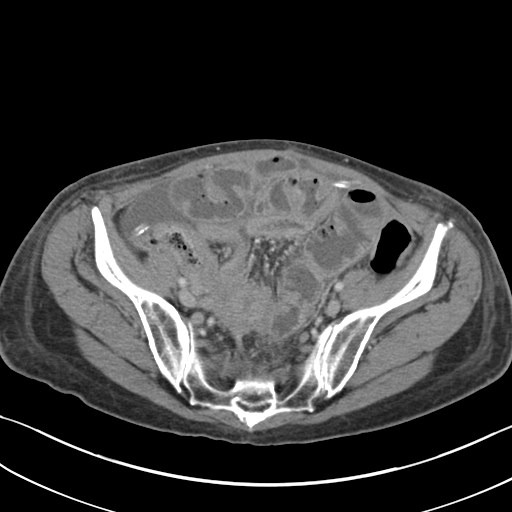
[im 35/88  soft-tissue]
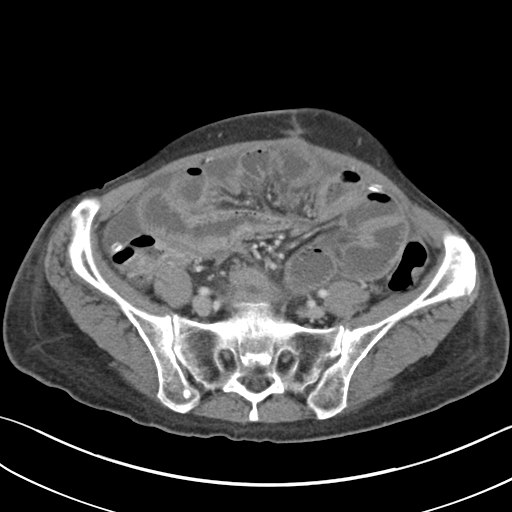
[im 47/88  soft-tissue]
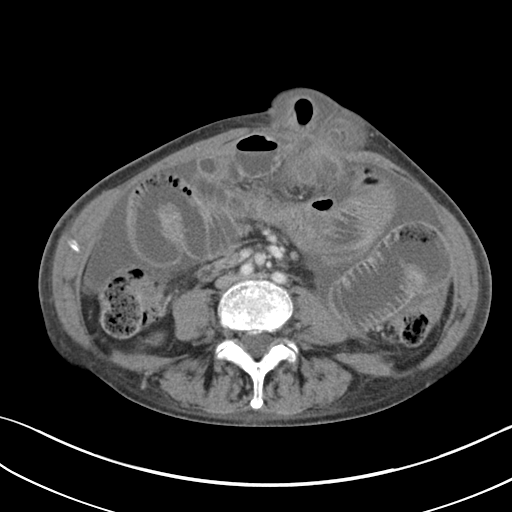
[im 53/88  soft-tissue]
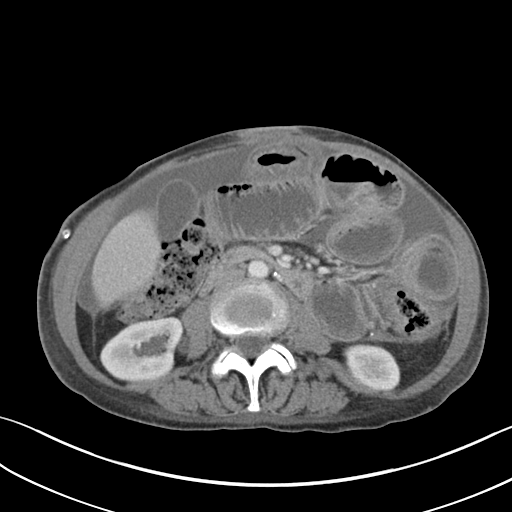
[im 59/88  soft-tissue]
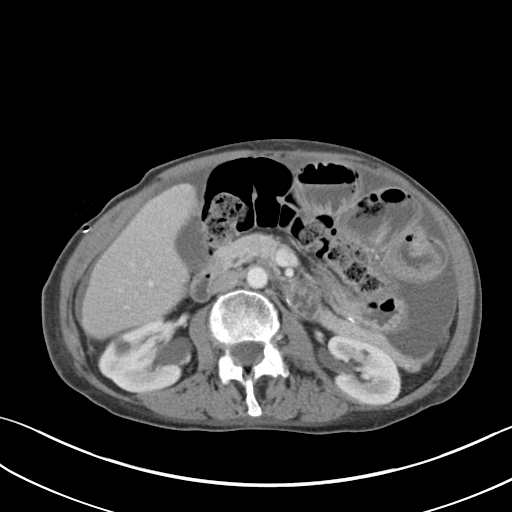
[im 59/88  bone]
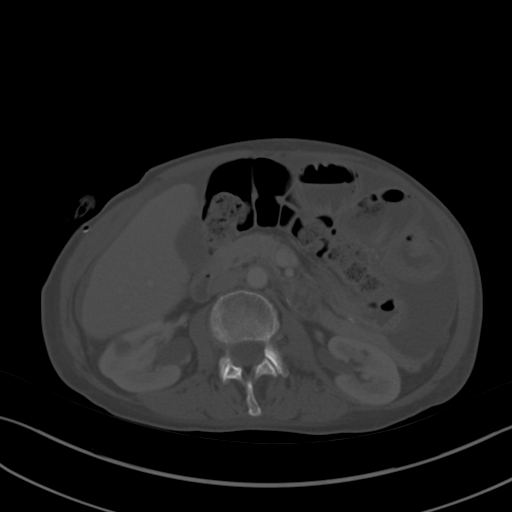
[im 64/88  soft-tissue]
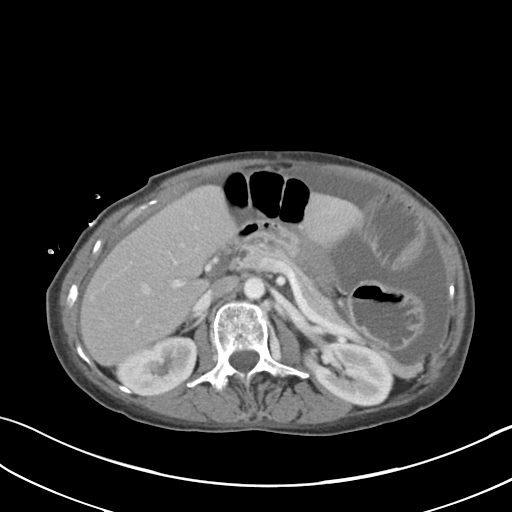
[im 70/88  soft-tissue]
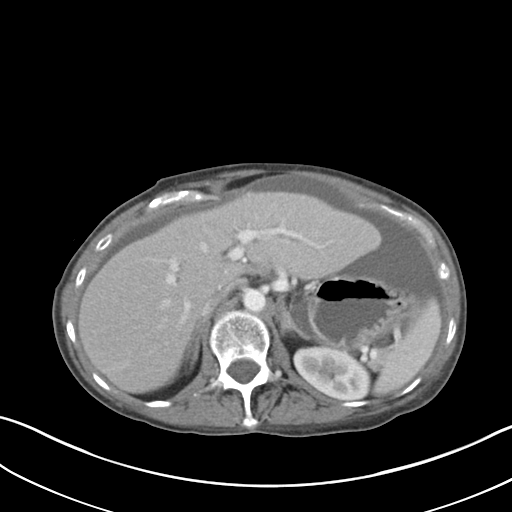
[im 76/88  soft-tissue]
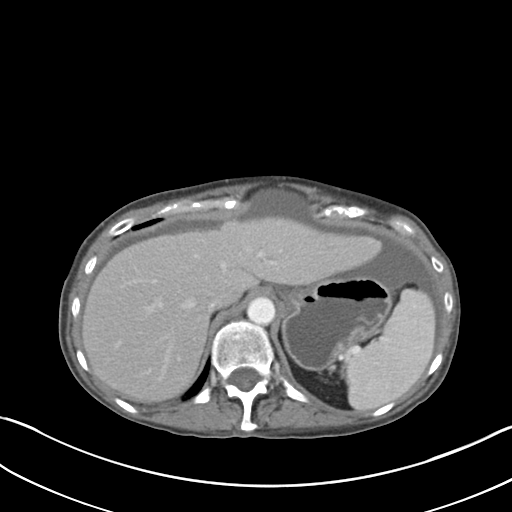
[im 82/88  soft-tissue]
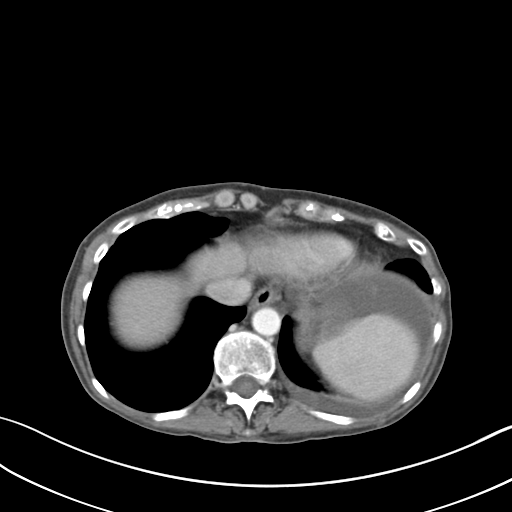

[Series 5: coronal a/|p · coronal · 0.73mm/px · 3 of 130 slices shown]
[im 44/130  soft-tissue]
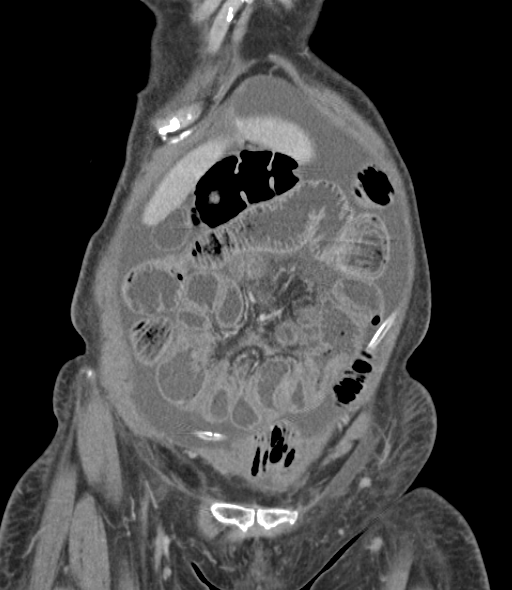
[im 58/130  soft-tissue]
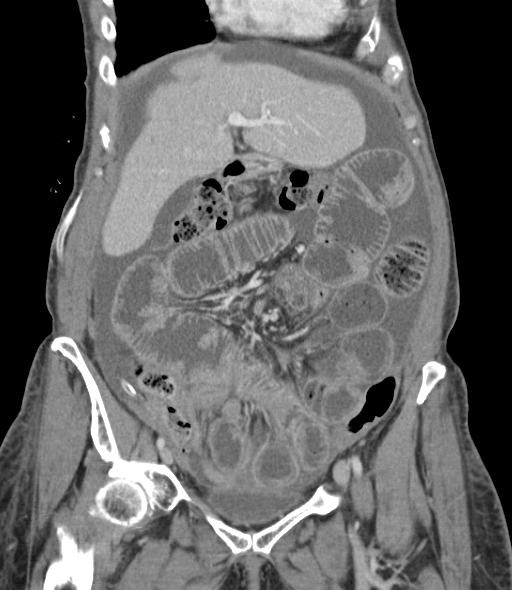
[im 72/130  soft-tissue]
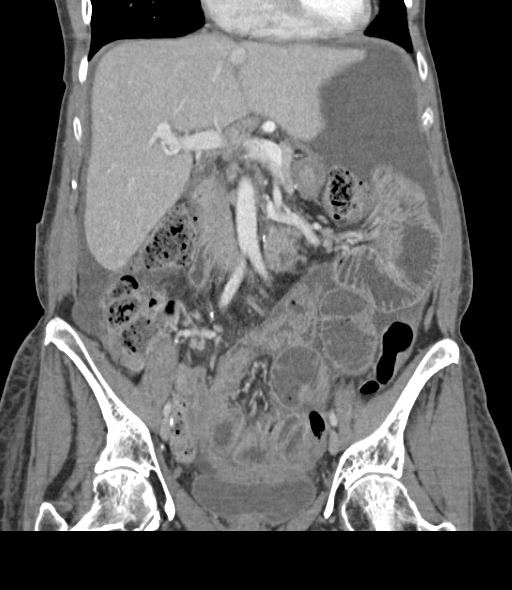

[16 of 46 positions shown; findings below may reference images not displayed]

FINDINGS: Small left pleural effusion is noted. No significant osseous
abnormality is noted.

No gallstones are noted. The liver, spleen and pancreas are
unremarkable. Adrenal glands appear normal. Right renal cyst is
noted. No hydronephrosis or renal obstruction is noted. Mild ascites
is noted with drainage catheter entering right midaxillary line and
tip in left lower quadrant of the abdomen.

No colonic dilatation is noted. Severe small bowel dilatation is
noted which appears to be due to large periumbilical hernia with
incarcerated small bowel loops. Urinary bladder appears normal.
Large ovarian masses noted on prior exam are not well visualized
currently and clinical correlation is recommended to determine if
these have been surgically removed. Patient is status post
hysterectomy. Abnormal soft tissue density is noted in the pelvis
which may represent residual or recurrent neoplasm or malignancy.
IMPRESSION: Mild ascites is noted with drainage catheter in place.

No colonic dilatation is noted. Severe small bowel dilatation is
noted which appears to be due to incarcerated small bowel loops
within large periumbilical hernia. Alternatively, the obstruction
may be due to abnormal soft tissue density seen posteriorly in the
pelvis which may represent recurrent or residual malignancy.

Large ovarian masses noted on prior exam are not well visualized
currently and clinical correlation is recommended determine if they
have been surgically removed.

## 2017-07-27 IMAGING — CR DG ABDOMEN ACUTE W/ 1V CHEST
4 series · 4 of 4 positions shown · non-contrast
Comparison: CT abdomen 01/12/2016, 10/30/2015

CLINICAL DATA: 64-year-old female with a history of nausea and
vomiting for 3 days.

EXAM:
DG ABDOMEN ACUTE W/ 1V CHEST

[w chest pa]
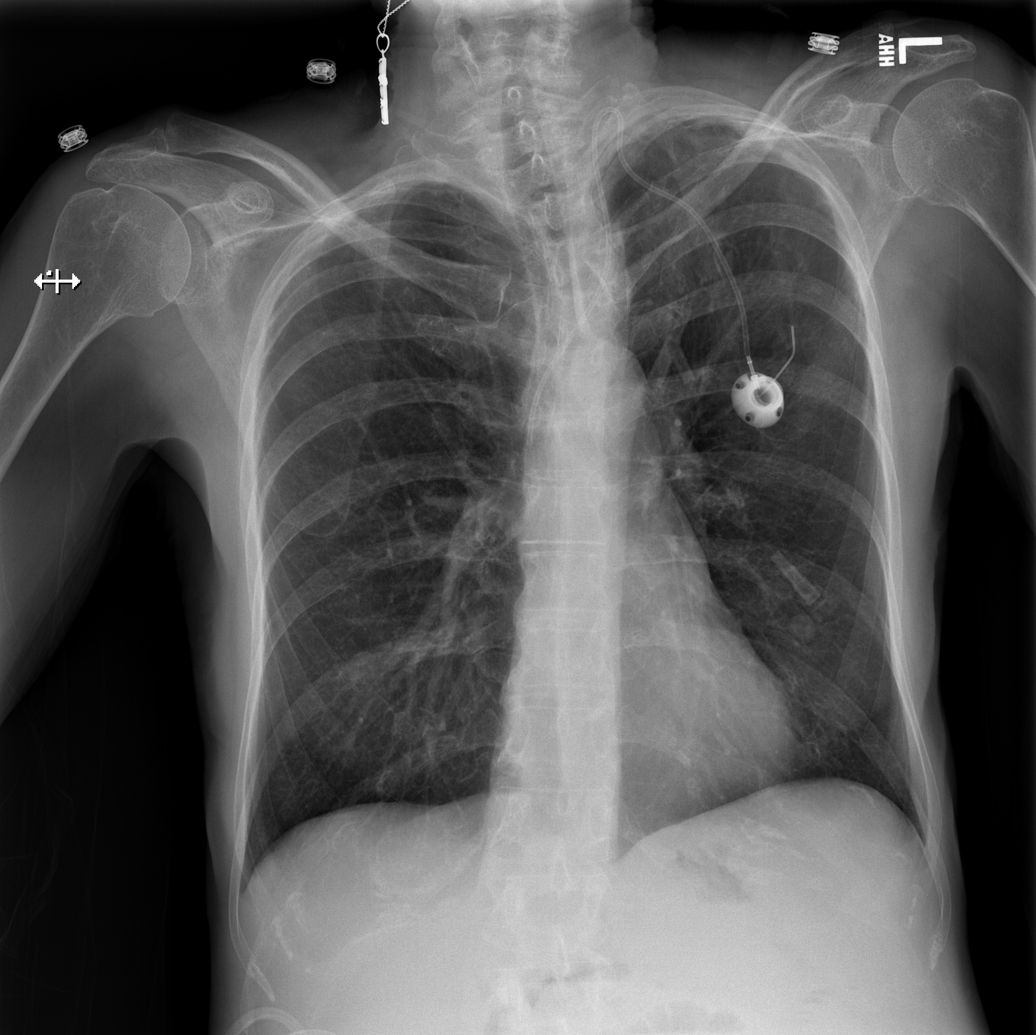

[w abdomen upright]
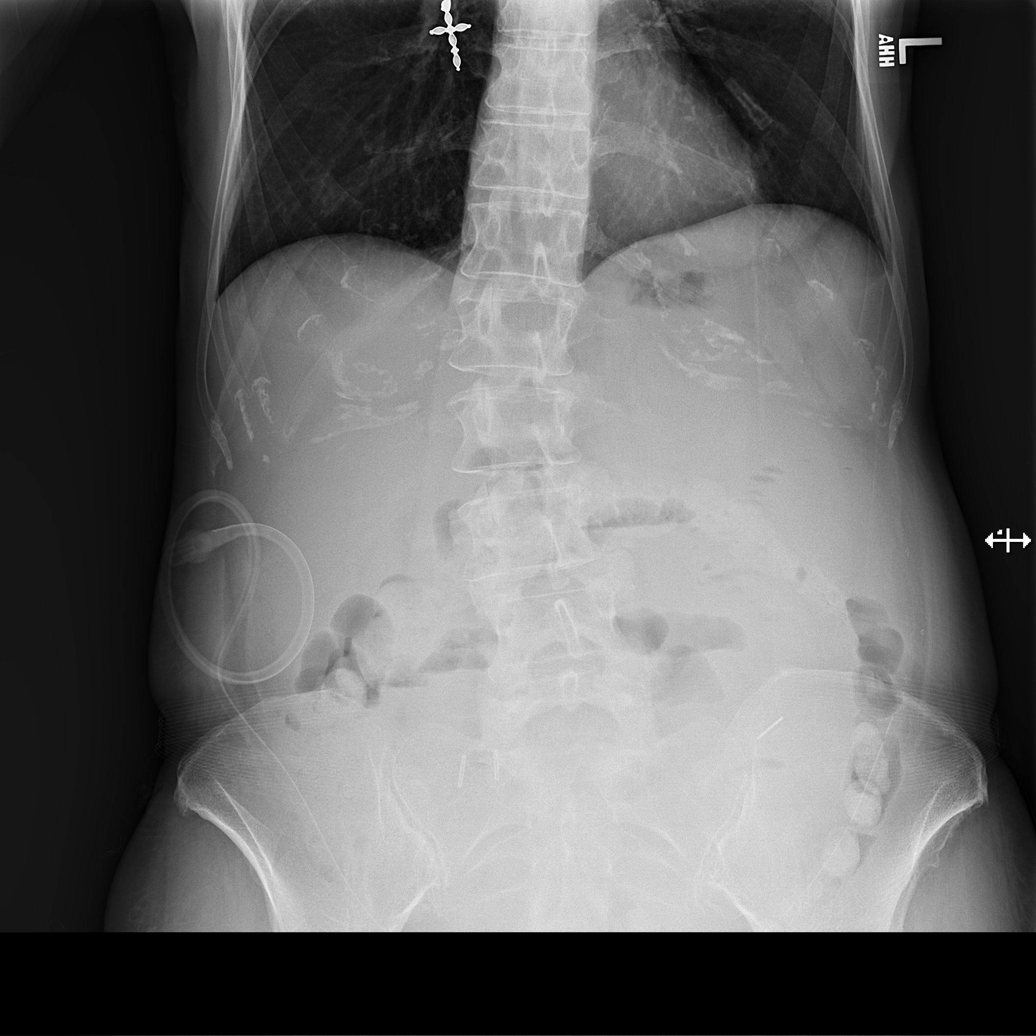

[t abdomen supine (1 of 2)]
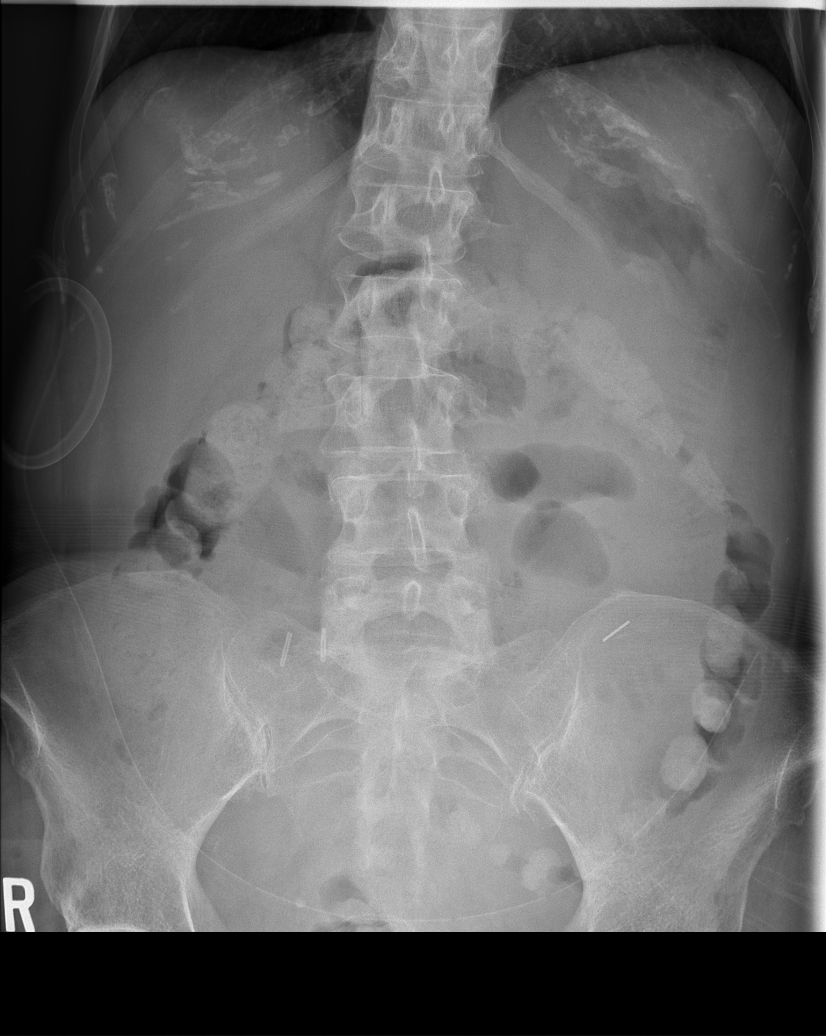

[t abdomen supine (2 of 2)]
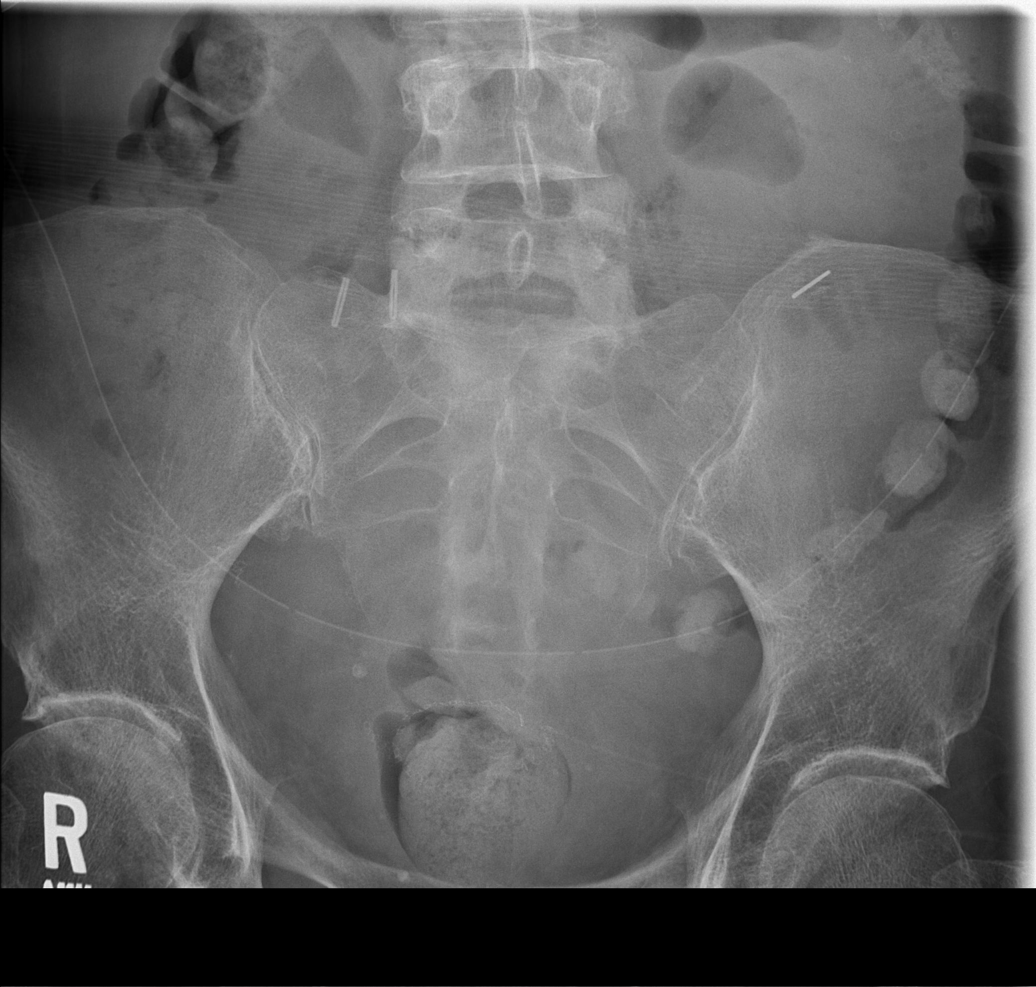

[4 of 4 positions shown; findings below may reference images not displayed]

FINDINGS: Chest:

Cardiomediastinal silhouette within normal limits.

No evidence of central vascular congestion.

Left chest wall port catheter via left IJ approach. Catheter appears
to terminate superior vena cava.

Coarsened interstitial markings of the bilateral lungs, similar to
the comparison plain film.

No pneumothorax pleural effusion or confluent airspace disease.

Abdomen:

Peritoneal dialysis catheter in position, with the external portion
of coiled over the right abdomen in the internal portion within the
low abdomen/ pelvis terminating in the left lower quadrant.

Surgical clips project over the anatomic pelvis. Surgical suture
line near the rectum.

Small fecal burden.

Gas within stomach, small bowel, and colon. No abnormal distended
small bowel or colon. The upright image demonstrates multiple
air-fluid levels.
IMPRESSION: Chest:

Chronic lung changes without evidence of superimposed acute
cardiopulmonary disease.

Left IJ port catheter.

Abdomen:

Multiple air-fluid levels on the upright image with relatively
decompressed colon, which may reflect persisting ileus or possible
partial small bowel obstruction. Correlation with patient clinical
presentation may be useful.

Peritoneal dialysis catheter in place within the abdomen.
